# Patient Record
Sex: Male | Born: 1945
Health system: Southern US, Community
[De-identification: ages and names within clinical notes are randomized; demographics above are authoritative.]

## PROBLEM LIST (undated history)

## (undated) DIAGNOSIS — M1990 Unspecified osteoarthritis, unspecified site: Secondary | ICD-10-CM

## (undated) DIAGNOSIS — I10 Essential (primary) hypertension: Secondary | ICD-10-CM

## (undated) DIAGNOSIS — E785 Hyperlipidemia, unspecified: Secondary | ICD-10-CM

## (undated) DIAGNOSIS — Z974 Presence of external hearing-aid: Secondary | ICD-10-CM

## (undated) DIAGNOSIS — K219 Gastro-esophageal reflux disease without esophagitis: Secondary | ICD-10-CM

## (undated) DIAGNOSIS — G5603 Carpal tunnel syndrome, bilateral upper limbs: Secondary | ICD-10-CM

## (undated) HISTORY — DX: Essential (primary) hypertension: I10

## (undated) HISTORY — DX: Hyperlipidemia, unspecified: E78.5

## (undated) HISTORY — DX: Unspecified osteoarthritis, unspecified site: M19.90

## (undated) HISTORY — PX: HERNIA REPAIR: SHX51

## (undated) HISTORY — PX: KNEE SURGERY: SHX244

---

## 2003-05-09 HISTORY — PX: COLONOSCOPY: SHX174

## 2010-02-18 ENCOUNTER — Ambulatory Visit: Payer: Self-pay | Admitting: Family Medicine

## 2015-02-26 DIAGNOSIS — I10 Essential (primary) hypertension: Secondary | ICD-10-CM | POA: Diagnosis not present

## 2015-03-29 ENCOUNTER — Other Ambulatory Visit: Payer: Self-pay | Admitting: Family Medicine

## 2015-04-30 ENCOUNTER — Other Ambulatory Visit: Payer: Self-pay | Admitting: Family Medicine

## 2015-05-30 ENCOUNTER — Other Ambulatory Visit: Payer: Self-pay | Admitting: Family Medicine

## 2015-06-01 ENCOUNTER — Encounter: Payer: Self-pay | Admitting: Family Medicine

## 2015-06-01 ENCOUNTER — Ambulatory Visit (INDEPENDENT_AMBULATORY_CARE_PROVIDER_SITE_OTHER): Payer: Medicare HMO | Admitting: Family Medicine

## 2015-06-01 VITALS — BP 130/70 | HR 62 | Ht 72.0 in | Wt 207.0 lb

## 2015-06-01 DIAGNOSIS — I10 Essential (primary) hypertension: Secondary | ICD-10-CM

## 2015-06-01 DIAGNOSIS — M19042 Primary osteoarthritis, left hand: Secondary | ICD-10-CM

## 2015-06-01 DIAGNOSIS — Z23 Encounter for immunization: Secondary | ICD-10-CM | POA: Diagnosis not present

## 2015-06-01 DIAGNOSIS — Z1211 Encounter for screening for malignant neoplasm of colon: Secondary | ICD-10-CM | POA: Diagnosis not present

## 2015-06-01 DIAGNOSIS — E785 Hyperlipidemia, unspecified: Secondary | ICD-10-CM | POA: Diagnosis not present

## 2015-06-01 DIAGNOSIS — M1990 Unspecified osteoarthritis, unspecified site: Secondary | ICD-10-CM

## 2015-06-01 DIAGNOSIS — M19041 Primary osteoarthritis, right hand: Secondary | ICD-10-CM

## 2015-06-01 LAB — HEMOCCULT GUIAC POC 1CARD (OFFICE): Fecal Occult Blood, POC: NEGATIVE

## 2015-06-01 MED ORDER — OMEGA-3-ACID ETHYL ESTERS 1 G PO CAPS: 1.0000 g | ORAL_CAPSULE | Freq: Two times a day (BID) | ORAL | Status: DC

## 2015-06-01 MED ORDER — AMLODIPINE BESYLATE 10 MG PO TABS: 10.0000 mg | ORAL_TABLET | Freq: Every day | ORAL | Status: DC

## 2015-06-01 MED ORDER — ASPIRIN 81 MG PO TABS: 81.0000 mg | ORAL_TABLET | Freq: Every day | ORAL | Status: AC

## 2015-06-01 MED ORDER — IBUPROFEN 200 MG PO TABS: 200.0000 mg | ORAL_TABLET | Freq: Four times a day (QID) | ORAL | Status: DC | PRN

## 2015-06-01 NOTE — Progress Notes (Signed)
Name: Angel Cummings.   MRN: [id number]    DOB: 1945-05-30   Date:06/01/2015       Progress Note  Subjective  Chief Complaint  Chief Complaint  Patient presents with  . Hypertension    Hypertension This is a chronic problem. The current episode started more than 1 year ago. The problem has been gradually improving since onset. The problem is controlled. Pertinent negatives include no anxiety, blurred vision, chest pain, headaches, malaise/fatigue, neck pain, orthopnea, palpitations, peripheral edema, PND, shortness of breath or sweats. Agents associated with hypertension include NSAIDs. Risk factors for coronary artery disease include dyslipidemia and male gender. Past treatments include calcium channel blockers. The current treatment provides moderate improvement. There are no compliance problems.  There is no history of angina, kidney disease, CAD/MI, CVA, heart failure, left ventricular hypertrophy, PVD, renovascular disease or retinopathy. There is no history of chronic renal disease or a hypertension causing med.  Hyperlipidemia This is a recurrent problem. The current episode started more than 1 month ago. The problem is controlled. He has no history of chronic renal disease. Pertinent negatives include no chest pain, focal weakness, myalgias or shortness of breath. Treatments tried: omega 3. The current treatment provides mild improvement of lipids. There are no compliance problems.     No problem-specific assessment & plan notes found for this encounter.   Past Medical History  Diagnosis Date  . Arthritis   . Hyperlipidemia   . Hypertension     Past Surgical History  Procedure Laterality Date  . Hernia repair    . Knee surgery Left   . Colonoscopy  2005    Dr Vickki Muff in [facility]    History reviewed. No pertinent family history.  Social History   Social History  . Marital Status: Single    Spouse Name: N/A  . Number of Children: N/A  . Years of Education: N/A    Occupational History  . Not on file.   Social History Main Topics  . Smoking status: Never Smoker   . Smokeless tobacco: Not on file  . Alcohol Use: 0.0 oz/week    0 Standard drinks or equivalent per week  . Drug Use: No  . Sexual Activity: Not Currently   Other Topics Concern  . Not on file   Social History Narrative  . No narrative on file    Allergies  Allergen Reactions  . Etodolac      Review of Systems  Constitutional: Negative for fever, chills, weight loss and malaise/fatigue.  HENT: Negative for ear discharge, ear pain and sore throat.   Eyes: Negative for blurred vision.  Respiratory: Negative for cough, sputum production, shortness of breath and wheezing.   Cardiovascular: Negative for chest pain, palpitations, orthopnea, leg swelling and PND.  Gastrointestinal: Negative for heartburn, nausea, abdominal pain, diarrhea, constipation, blood in stool and melena.  Genitourinary: Negative for dysuria, urgency, frequency and hematuria.  Musculoskeletal: Positive for joint pain. Negative for myalgias, back pain and neck pain.  Skin: Negative for rash.  Neurological: Negative for dizziness, tingling, sensory change, focal weakness and headaches.  Endo/Heme/Allergies: Negative for environmental allergies and polydipsia. Does not bruise/bleed easily.  Psychiatric/Behavioral: Negative for depression and suicidal ideas. The patient is not nervous/anxious and does not have insomnia.      Objective  Filed Vitals:   06/01/15 0819  BP: 130/70  Pulse: 62  Height: 6' (1.829 m)  Weight: 207 lb (93.895 kg)    Physical Exam  Constitutional: He is oriented  to person, place, and time and well-developed, well-nourished, and in no distress.  HENT:  Head: Normocephalic.  Right Ear: External ear normal.  Left Ear: External ear normal.  Nose: Nose normal.  Mouth/Throat: Oropharynx is clear and moist.  Eyes: Conjunctivae and EOM are normal. Pupils are equal, round, and  reactive to light. Right eye exhibits no discharge. Left eye exhibits no discharge. No scleral icterus.  Neck: Normal range of motion. Neck supple. No JVD present. No tracheal deviation present. No thyromegaly present.  Cardiovascular: Normal rate, regular rhythm, normal heart sounds and intact distal pulses.  Exam reveals no gallop and no friction rub.   No murmur heard. Pulmonary/Chest: Breath sounds normal. No respiratory distress. He has no wheezes. He has no rales.  Abdominal: Soft. Bowel sounds are normal. He exhibits no mass. There is no hepatosplenomegaly. There is no tenderness. There is no rebound, no guarding and no CVA tenderness.  Genitourinary: Rectum normal.  Musculoskeletal: Normal range of motion. He exhibits no edema or tenderness.  Lymphadenopathy:    He has no cervical adenopathy.  Neurological: He is alert and oriented to person, place, and time. He has normal sensation, normal strength, normal reflexes and intact cranial nerves. No cranial nerve deficit.  Skin: Skin is warm. No rash noted.  Psychiatric: Mood and affect normal.  Nursing note and vitals reviewed.     Assessment & Plan  Problem List Items Addressed This Visit      Cardiovascular and Mediastinum   Essential hypertension - Primary   Relevant Medications   amLODipine (NORVASC) 10 MG tablet   aspirin 81 MG tablet   omega-3 acid ethyl esters (LOVAZA) 1 g capsule   Other Relevant Orders   Renal Function Panel     Other   Hyperlipidemia   Relevant Medications   amLODipine (NORVASC) 10 MG tablet   aspirin 81 MG tablet   omega-3 acid ethyl esters (LOVAZA) 1 g capsule   Other Relevant Orders   Lipid Profile    Other Visit Diagnoses    Arthritis of both hands        Relevant Medications    aspirin 81 MG tablet    ibuprofen (ADVIL,MOTRIN) 200 MG tablet    Colon cancer screening        Relevant Orders    Ambulatory referral to Gastroenterology    POCT Occult Blood Stool (Completed)    Need for  Tdap vaccination        Relevant Orders    Tdap vaccine greater than or equal to 7yo IM (Completed)    Need for pneumococcal vaccination        Relevant Orders    Pneumococcal conjugate vaccine 13-valent (Completed)         Dr. Khloie [facility]  06/01/2015

## 2015-06-02 LAB — RENAL FUNCTION PANEL
Albumin: 4.2 g/dL (ref 3.6–4.8)
BUN/Creatinine Ratio: 13 (ref 10–22)
BUN: 12 mg/dL (ref 8–27)
CO2: 23 mmol/L (ref 18–29)
CREATININE: 0.95 mg/dL (ref 0.76–1.27)
Calcium: 8.7 mg/dL (ref 8.6–10.2)
Chloride: 104 mmol/L (ref 96–106)
GFR, EST AFRICAN AMERICAN: 95 mL/min/{1.73_m2} (ref >59)
GFR, EST NON AFRICAN AMERICAN: 82 mL/min/{1.73_m2} (ref >59)
GLUCOSE: 99 mg/dL (ref 65–99)
POTASSIUM: 4.1 mmol/L (ref 3.5–5.2)
Phosphorus: 3.1 mg/dL (ref 2.5–4.5)
SODIUM: 143 mmol/L (ref 134–144)

## 2015-06-02 LAB — LIPID PANEL
CHOLESTEROL TOTAL: 223 mg/dL — AB (ref 100–199)
Chol/HDL Ratio: 4.7 ratio units (ref 0.0–5.0)
HDL: 47 mg/dL (ref >39)
LDL Calculated: 150 mg/dL — ABNORMAL HIGH (ref 0–99)
TRIGLYCERIDES: 128 mg/dL (ref 0–149)
VLDL Cholesterol Cal: 26 mg/dL (ref 5–40)

## 2015-06-04 ENCOUNTER — Other Ambulatory Visit: Payer: Self-pay

## 2015-06-04 DIAGNOSIS — M1990 Unspecified osteoarthritis, unspecified site: Secondary | ICD-10-CM

## 2015-06-04 MED ORDER — DICLOFENAC SODIUM 1 % TD GEL: 2.0000 g | Freq: Two times a day (BID) | TRANSDERMAL | Status: DC

## 2015-06-21 ENCOUNTER — Encounter: Payer: Self-pay | Admitting: Family Medicine

## 2015-06-21 ENCOUNTER — Ambulatory Visit (INDEPENDENT_AMBULATORY_CARE_PROVIDER_SITE_OTHER): Payer: Medicare HMO | Admitting: Family Medicine

## 2015-06-21 VITALS — BP 138/72 | HR 80 | Temp 99.2°F | Ht 72.0 in | Wt 202.0 lb

## 2015-06-21 DIAGNOSIS — J40 Bronchitis, not specified as acute or chronic: Secondary | ICD-10-CM

## 2015-06-21 DIAGNOSIS — J0100 Acute maxillary sinusitis, unspecified: Secondary | ICD-10-CM

## 2015-06-21 MED ORDER — GUAIFENESIN-CODEINE 100-10 MG/5ML PO SYRP: 5.0000 mL | ORAL_SOLUTION | Freq: Three times a day (TID) | ORAL | Status: DC | PRN

## 2015-06-21 MED ORDER — AMOXICILLIN 500 MG PO CAPS: 500.0000 mg | ORAL_CAPSULE | Freq: Three times a day (TID) | ORAL | Status: DC

## 2015-06-21 NOTE — Progress Notes (Signed)
Name: Angel Cummings.   MRN: [id number]    DOB: July 29, 1945   Date:06/21/2015       Progress Note  Subjective  Chief Complaint  Chief Complaint  Patient presents with  . Sinusitis    cough and cong- tried mucinex D- hasn't helped. coughing up thick and colored production    Sinusitis This is a new problem. The current episode started in the past 7 days. Associated symptoms include chills, congestion, coughing, diaphoresis, headaches, a hoarse voice, sinus pressure, sneezing, a sore throat and swollen glands. Pertinent negatives include no ear pain, neck pain or shortness of breath. (Bloody sinus drainage) Past treatments include acetaminophen and oral decongestants. The treatment provided mild relief.  Cough This is a new problem. The current episode started in the past 7 days. The problem has been gradually worsening. The cough is productive of purulent sputum. Associated symptoms include chills, headaches and a sore throat. Pertinent negatives include no chest pain, ear pain, fever, heartburn, myalgias, rash, shortness of breath, weight loss or wheezing. The treatment provided no relief. There is no history of environmental allergies.    No problem-specific assessment & plan notes found for this encounter.   Past Medical History  Diagnosis Date  . Arthritis   . Hyperlipidemia   . Hypertension     Past Surgical History  Procedure Laterality Date  . Hernia repair    . Knee surgery Left   . Colonoscopy  2005    Dr Vickki Muff in [facility]    History reviewed. No pertinent family history.  Social History   Social History  . Marital Status: Single    Spouse Name: N/A  . Number of Children: N/A  . Years of Education: N/A   Occupational History  . Not on file.   Social History Main Topics  . Smoking status: Never Smoker   . Smokeless tobacco: Not on file  . Alcohol Use: 0.0 oz/week    0 Standard drinks or equivalent per week  . Drug Use: No  . Sexual Activity: Not Currently    Other Topics Concern  . Not on file   Social History Narrative    Allergies  Allergen Reactions  . Etodolac      Review of Systems  Constitutional: Positive for chills and diaphoresis. Negative for fever, weight loss and malaise/fatigue.  HENT: Positive for congestion, hoarse voice, sinus pressure, sneezing and sore throat. Negative for ear discharge and ear pain.   Eyes: Negative for blurred vision.  Respiratory: Positive for cough. Negative for sputum production, shortness of breath and wheezing.   Cardiovascular: Negative for chest pain, palpitations and leg swelling.  Gastrointestinal: Negative for heartburn, nausea, abdominal pain, diarrhea, constipation, blood in stool and melena.  Genitourinary: Negative for dysuria, urgency, frequency and hematuria.  Musculoskeletal: Negative for myalgias, back pain, joint pain and neck pain.  Skin: Negative for rash.  Neurological: Positive for headaches. Negative for dizziness, tingling, sensory change and focal weakness.  Endo/Heme/Allergies: Negative for environmental allergies and polydipsia. Does not bruise/bleed easily.  Psychiatric/Behavioral: Negative for depression and suicidal ideas. The patient is not nervous/anxious and does not have insomnia.      Objective  Filed Vitals:   06/21/15 0806  BP: 138/72  Pulse: 80  Temp: 99.2 F (37.3 C)  TempSrc: Oral  Height: 6' (1.829 m)  Weight: 202 lb (91.627 kg)    Physical Exam  Constitutional: He is oriented to person, place, and time and well-developed, well-nourished, and in no distress.  HENT:  Head: Normocephalic.  Right Ear: Tympanic membrane, external ear and ear canal normal. Decreased hearing is noted.  Left Ear: Tympanic membrane, external ear and ear canal normal. Decreased hearing is noted.  Nose: Nose normal.  Mouth/Throat: Oropharynx is clear and moist.  Eyes: Conjunctivae and EOM are normal. Pupils are equal, round, and reactive to light. Right eye exhibits  no discharge. Left eye exhibits no discharge. No scleral icterus.  Neck: Normal range of motion. Neck supple. No JVD present. No tracheal deviation present. No thyromegaly present.  Cardiovascular: Normal rate, regular rhythm, normal heart sounds and intact distal pulses.  Exam reveals no gallop and no friction rub.   No murmur heard. Pulmonary/Chest: Breath sounds normal. No respiratory distress. He has no wheezes. He has no rales.  Abdominal: Soft. Bowel sounds are normal. He exhibits no mass. There is no hepatosplenomegaly. There is no tenderness. There is no rebound, no guarding and no CVA tenderness.  Musculoskeletal: Normal range of motion. He exhibits no edema or tenderness.  Lymphadenopathy:    He has no cervical adenopathy.  Neurological: He is alert and oriented to person, place, and time. He has normal sensation, normal strength and intact cranial nerves. No cranial nerve deficit.  Skin: Skin is warm. No rash noted.  Psychiatric: Mood and affect normal.      Assessment & Plan  Problem List Items Addressed This Visit    None    Visit Diagnoses    Acute maxillary sinusitis, recurrence not specified    -  Primary    Relevant Medications    amoxicillin (AMOXIL) 500 MG capsule    guaiFENesin-codeine (ROBITUSSIN AC) 100-10 MG/5ML syrup    Bronchitis        Relevant Medications    amoxicillin (AMOXIL) 500 MG capsule    guaiFENesin-codeine (ROBITUSSIN AC) 100-10 MG/5ML syrup         Dr. Deanna Jones [facility]  06/21/2015

## 2015-12-03 DIAGNOSIS — Z0000 Encounter for general adult medical examination without abnormal findings: Secondary | ICD-10-CM | POA: Diagnosis not present

## 2015-12-03 DIAGNOSIS — I10 Essential (primary) hypertension: Secondary | ICD-10-CM | POA: Diagnosis not present

## 2015-12-09 ENCOUNTER — Other Ambulatory Visit: Payer: Self-pay

## 2015-12-29 ENCOUNTER — Other Ambulatory Visit: Payer: Self-pay | Admitting: Family Medicine

## 2015-12-29 DIAGNOSIS — I10 Essential (primary) hypertension: Secondary | ICD-10-CM

## 2016-01-30 ENCOUNTER — Other Ambulatory Visit: Payer: Self-pay | Admitting: Family Medicine

## 2016-01-30 DIAGNOSIS — I10 Essential (primary) hypertension: Secondary | ICD-10-CM

## 2016-02-29 ENCOUNTER — Other Ambulatory Visit: Payer: Self-pay | Admitting: Family Medicine

## 2016-02-29 DIAGNOSIS — I10 Essential (primary) hypertension: Secondary | ICD-10-CM

## 2016-04-07 ENCOUNTER — Other Ambulatory Visit: Payer: Self-pay | Admitting: Family Medicine

## 2016-04-07 ENCOUNTER — Other Ambulatory Visit: Payer: Self-pay

## 2016-04-07 DIAGNOSIS — I10 Essential (primary) hypertension: Secondary | ICD-10-CM

## 2016-04-07 MED ORDER — AMLODIPINE BESYLATE 10 MG PO TABS: 10.0000 mg | ORAL_TABLET | Freq: Every day | ORAL | 0 refills | Status: DC

## 2016-04-17 ENCOUNTER — Other Ambulatory Visit: Payer: Self-pay | Admitting: Family Medicine

## 2016-04-17 ENCOUNTER — Ambulatory Visit
Admission: RE | Admit: 2016-04-17 | Discharge: 2016-04-17 | Disposition: A | Discharge status: Home / Self Care | Payer: Medicare HMO | Source: Ambulatory Visit | Attending: Family Medicine | Admitting: Family Medicine

## 2016-04-17 ENCOUNTER — Ambulatory Visit (INDEPENDENT_AMBULATORY_CARE_PROVIDER_SITE_OTHER): Payer: Medicare HMO | Admitting: Family Medicine

## 2016-04-17 ENCOUNTER — Encounter: Payer: Self-pay | Admitting: Family Medicine

## 2016-04-17 VITALS — BP 160/80 | HR 64 | Ht 71.0 in | Wt 206.0 lb

## 2016-04-17 DIAGNOSIS — M19041 Primary osteoarthritis, right hand: Secondary | ICD-10-CM | POA: Insufficient documentation

## 2016-04-17 DIAGNOSIS — E782 Mixed hyperlipidemia: Secondary | ICD-10-CM | POA: Diagnosis not present

## 2016-04-17 DIAGNOSIS — M19042 Primary osteoarthritis, left hand: Secondary | ICD-10-CM | POA: Diagnosis not present

## 2016-04-17 DIAGNOSIS — M19049 Primary osteoarthritis, unspecified hand: Secondary | ICD-10-CM | POA: Diagnosis not present

## 2016-04-17 DIAGNOSIS — I10 Essential (primary) hypertension: Secondary | ICD-10-CM

## 2016-04-17 MED ORDER — AMLODIPINE BESYLATE 10 MG PO TABS: 10.0000 mg | ORAL_TABLET | Freq: Every day | ORAL | 6 refills | Status: DC

## 2016-04-17 MED ORDER — OMEGA-3-ACID ETHYL ESTERS 1 G PO CAPS: 1.0000 g | ORAL_CAPSULE | Freq: Two times a day (BID) | ORAL | 6 refills | Status: DC

## 2016-04-17 NOTE — Progress Notes (Signed)
Name: Angel Cummings.   MRN: [id number]    DOB: June 22, 1945   Date:04/17/2016       Progress Note  Subjective  Chief Complaint  Chief Complaint  Patient presents with  . Hypertension    Hypertension  This is a chronic problem. The current episode started more than 1 year ago. The problem has been gradually improving since onset. The problem is controlled (did not take medication this am). Pertinent negatives include no anxiety, blurred vision, chest pain, headaches, malaise/fatigue, neck pain, orthopnea, palpitations, peripheral edema, PND, shortness of breath or sweats. There are no associated agents to hypertension. There are no known risk factors for coronary artery disease. Past treatments include ACE inhibitors and diuretics. The current treatment provides mild improvement. There are no compliance problems.  There is no history of angina, kidney disease, CAD/MI, CVA, heart failure, left ventricular hypertrophy, PVD, renovascular disease or retinopathy. There is no history of chronic renal disease or a hypertension causing med.  Hand Pain   The incident occurred more than 1 week ago. There was no injury mechanism. The pain is present in the right fingers and left fingers. The quality of the pain is described as cramping (gradually flexion due to tendon fibrosis). The pain does not radiate. The pain is at a severity of 2/10. The pain is moderate. The pain has been worsening since the incident. Pertinent negatives include no chest pain or tingling. The symptoms are aggravated by movement. He has tried NSAIDs for the symptoms. The treatment provided moderate relief.    No problem-specific Assessment & Plan notes found for this encounter.   Past Medical History:  Diagnosis Date  . Arthritis   . Hyperlipidemia   . Hypertension     Past Surgical History:  Procedure Laterality Date  . COLONOSCOPY  2005   Dr Vickki Muff in [place] Left     History reviewed.  No pertinent family history.  Social History   Social History  . Marital status: Single    Spouse name: N/A  . Number of children: N/A  . Years of education: N/A   Occupational History  . Not on file.   Social History Main Topics  . Smoking status: Never Smoker  . Smokeless tobacco: Not on file  . Alcohol use 0.0 oz/week  . Drug use: No  . Sexual activity: Not Currently   Other Topics Concern  . Not on file   Social History Narrative  . No narrative on file    Allergies  Allergen Reactions  . Etodolac      Review of Systems  Constitutional: Negative for chills, fever, malaise/fatigue and weight loss.  HENT: Negative for ear discharge, ear pain and sore throat.   Eyes: Negative for blurred vision.  Respiratory: Negative for cough, sputum production, shortness of breath and wheezing.   Cardiovascular: Negative for chest pain, palpitations, orthopnea, leg swelling and PND.  Gastrointestinal: Negative for abdominal pain, blood in stool, constipation, diarrhea, heartburn, melena and nausea.  Genitourinary: Negative for dysuria, frequency, hematuria and urgency.  Musculoskeletal: Negative for back pain, joint pain, myalgias and neck pain.  Skin: Negative for rash.  Neurological: Negative for dizziness, tingling, sensory change, focal weakness and headaches.  Endo/Heme/Allergies: Negative for environmental allergies and polydipsia. Does not bruise/bleed easily.  Psychiatric/Behavioral: Negative for depression and suicidal ideas. The patient is not nervous/anxious and does not have insomnia.      Objective  Vitals:  04/17/16 0909  BP: (!) 160/80  Pulse: 64  Weight: 206 lb (93.4 kg)  Height: 5\' 11"  (1.803 m)    Physical Exam  Constitutional: He is oriented to person, place, and time and well-developed, well-nourished, and in no distress.  HENT:  Head: Normocephalic.  Right Ear: External ear normal.  Left Ear: External ear normal.  Nose: Nose normal.   Mouth/Throat: Oropharynx is clear and moist.  Eyes: Conjunctivae and EOM are normal. Pupils are equal, round, and reactive to light. Right eye exhibits no discharge. Left eye exhibits no discharge. No scleral icterus.  Neck: Normal range of motion. Neck supple. No JVD present. No tracheal deviation present. No thyromegaly present.  Cardiovascular: Normal rate, regular rhythm, normal heart sounds and intact distal pulses.  Exam reveals no gallop and no friction rub.   No murmur heard. Pulmonary/Chest: Breath sounds normal. No respiratory distress. He has no wheezes. He has no rales.  Abdominal: Soft. Bowel sounds are normal. He exhibits no mass. There is no hepatosplenomegaly. There is no tenderness. There is no rebound, no guarding and no CVA tenderness.  Musculoskeletal: He exhibits no edema or tenderness.       Right hand: He exhibits decreased range of motion and deformity.       Left hand: He exhibits decreased range of motion and deformity.  Lymphadenopathy:    He has no cervical adenopathy.  Neurological: He is alert and oriented to person, place, and time. He has normal sensation, normal strength, normal reflexes and intact cranial nerves. No cranial nerve deficit.  Skin: Skin is warm. No rash noted.  Psychiatric: Mood and affect normal.  Nursing note and vitals reviewed.     Assessment & Plan  Problem List Items Addressed This Visit      Cardiovascular and Mediastinum   Essential hypertension - Primary   Relevant Medications   amLODipine (NORVASC) 10 MG tablet   omega-3 acid ethyl esters (LOVAZA) 1 g capsule   Other Relevant Orders   Renal Function Panel     Musculoskeletal and Integument   Arthritis of both hands   Relevant Orders   DG Hand Complete Left   DG Hand Complete Right   Arthritis of hand     Other   Hyperlipidemia   Relevant Medications   amLODipine (NORVASC) 10 MG tablet   omega-3 acid ethyl esters (LOVAZA) 1 g capsule   Other Relevant Orders    Lipid Profile        Dr. Otilio [facility]u [facility]  04/17/16

## 2016-04-18 LAB — RENAL FUNCTION PANEL
Albumin: 4.5 g/dL (ref 3.6–4.8)
BUN / CREAT RATIO: 18 (ref 10–24)
BUN: 16 mg/dL (ref 8–27)
CHLORIDE: 105 mmol/L (ref 96–106)
CO2: 23 mmol/L (ref 18–29)
Calcium: 9 mg/dL (ref 8.6–10.2)
Creatinine, Ser: 0.88 mg/dL (ref 0.76–1.27)
GFR calc non Af Amer: 88 mL/min/{1.73_m2} (ref >59)
GFR, EST AFRICAN AMERICAN: 102 mL/min/{1.73_m2} (ref >59)
GLUCOSE: 91 mg/dL (ref 65–99)
POTASSIUM: 4.5 mmol/L (ref 3.5–5.2)
Phosphorus: 3.2 mg/dL (ref 2.5–4.5)
SODIUM: 146 mmol/L — AB (ref 134–144)

## 2016-04-18 LAB — LIPID PANEL
Chol/HDL Ratio: 4.3 ratio units (ref 0.0–5.0)
Cholesterol, Total: 230 mg/dL — ABNORMAL HIGH (ref 100–199)
HDL: 53 mg/dL (ref >39)
LDL Calculated: 152 mg/dL — ABNORMAL HIGH (ref 0–99)
Triglycerides: 127 mg/dL (ref 0–149)
VLDL CHOLESTEROL CAL: 25 mg/dL (ref 5–40)

## 2016-04-21 ENCOUNTER — Other Ambulatory Visit: Payer: Self-pay

## 2016-04-21 DIAGNOSIS — M79641 Pain in right hand: Secondary | ICD-10-CM

## 2016-04-21 DIAGNOSIS — M79642 Pain in left hand: Principal | ICD-10-CM

## 2016-05-09 ENCOUNTER — Other Ambulatory Visit: Payer: Self-pay

## 2016-05-09 DIAGNOSIS — E782 Mixed hyperlipidemia: Secondary | ICD-10-CM

## 2016-05-09 MED ORDER — OMEGA-3-ACID ETHYL ESTERS 1 G PO CAPS: 1.0000 g | ORAL_CAPSULE | Freq: Two times a day (BID) | ORAL | 6 refills | Status: DC

## 2016-05-15 DIAGNOSIS — D485 Neoplasm of uncertain behavior of skin: Secondary | ICD-10-CM | POA: Diagnosis not present

## 2016-05-15 DIAGNOSIS — C44612 Basal cell carcinoma of skin of right upper limb, including shoulder: Secondary | ICD-10-CM | POA: Diagnosis not present

## 2016-05-15 DIAGNOSIS — C44519 Basal cell carcinoma of skin of other part of trunk: Secondary | ICD-10-CM | POA: Diagnosis not present

## 2016-05-15 DIAGNOSIS — Z85828 Personal history of other malignant neoplasm of skin: Secondary | ICD-10-CM | POA: Diagnosis not present

## 2016-05-15 DIAGNOSIS — C4441 Basal cell carcinoma of skin of scalp and neck: Secondary | ICD-10-CM | POA: Diagnosis not present

## 2016-06-01 DIAGNOSIS — M19042 Primary osteoarthritis, left hand: Secondary | ICD-10-CM | POA: Diagnosis not present

## 2016-06-23 ENCOUNTER — Other Ambulatory Visit: Payer: Self-pay

## 2016-08-28 ENCOUNTER — Ambulatory Visit (INDEPENDENT_AMBULATORY_CARE_PROVIDER_SITE_OTHER): Payer: Medicare HMO | Admitting: Family Medicine

## 2016-08-28 ENCOUNTER — Other Ambulatory Visit: Payer: Self-pay

## 2016-08-28 VITALS — BP 138/80 | HR 64 | Ht 71.0 in | Wt 211.0 lb

## 2016-08-28 DIAGNOSIS — G8929 Other chronic pain: Secondary | ICD-10-CM | POA: Diagnosis not present

## 2016-08-28 DIAGNOSIS — R69 Illness, unspecified: Secondary | ICD-10-CM

## 2016-08-28 DIAGNOSIS — M79672 Pain in left foot: Secondary | ICD-10-CM

## 2016-08-28 NOTE — Progress Notes (Signed)
Name: Angel Cummings.   MRN: [id number]    DOB: Oct 19, 1945   Date:08/28/2016       Progress Note  Subjective  Chief Complaint  Chief Complaint  Patient presents with  . Foot Pain    L) foot- thinks he has plantar fasciaitis- has been taking Meloxicam with no relief- needs appt to podiatry. Mainly hurting in the heel that gets worse as the day goes on    Foot Pain  This is a new problem. The current episode started more than 1 month ago. The problem occurs constantly. The problem has been waxing and waning. Pertinent negatives include no abdominal pain, anorexia, arthralgias, change in bowel habit, chest pain, chills, congestion, coughing, diaphoresis, fatigue, fever, headaches, joint swelling, myalgias, nausea, neck pain, numbness, rash, sore throat, swollen glands, urinary symptoms, vertigo, visual change, vomiting or weakness. The symptoms are aggravated by standing and walking. He has tried NSAIDs and acetaminophen for the symptoms. The treatment provided mild relief.    No problem-specific Assessment & Plan notes found for this encounter.   Past Medical History:  Diagnosis Date  . Arthritis   . Hyperlipidemia   . Hypertension     Past Surgical History:  Procedure Laterality Date  . COLONOSCOPY  2005   Dr Vickki Muff in [place] Left     No family history on file.  Social History   Social History  . Marital status: Single    Spouse name: N/A  . Number of children: N/A  . Years of education: N/A   Occupational History  . Not on file.   Social History Main Topics  . Smoking status: Never Smoker  . Smokeless tobacco: Not on file  . Alcohol use 0.0 oz/week  . Drug use: No  . Sexual activity: Not Currently   Other Topics Concern  . Not on file   Social History Narrative  . No narrative on file    Allergies  Allergen Reactions  . Etodolac     Outpatient Medications Prior to Visit  Medication Sig Dispense Refill  . amLODipine  (NORVASC) 10 MG tablet Take 1 tablet (10 mg total) by mouth daily. 30 tablet 6  . aspirin 81 MG tablet Take 1 tablet (81 mg total) by mouth daily. 60 tablet 6  . ibuprofen (ADVIL,MOTRIN) 200 MG tablet Take 1 tablet (200 mg total) by mouth every 6 (six) hours as needed. 30 tablet 11  . omega-3 acid ethyl esters (LOVAZA) 1 g capsule Take 1 capsule (1 g total) by mouth 2 (two) times daily. 60 capsule 6   No facility-administered medications prior to visit.     Review of Systems  Constitutional: Negative for chills, diaphoresis, fatigue, fever, malaise/fatigue and weight loss.  HENT: Negative for congestion, ear discharge, ear pain and sore throat.   Eyes: Negative for blurred vision.  Respiratory: Negative for cough, sputum production, shortness of breath and wheezing.   Cardiovascular: Negative for chest pain, palpitations and leg swelling.  Gastrointestinal: Negative for abdominal pain, anorexia, blood in stool, change in bowel habit, constipation, diarrhea, heartburn, melena, nausea and vomiting.  Genitourinary: Negative for dysuria, frequency, hematuria and urgency.  Musculoskeletal: Negative for arthralgias, back pain, joint pain, joint swelling, myalgias and neck pain.  Skin: Negative for rash.  Neurological: Negative for dizziness, vertigo, tingling, sensory change, focal weakness, weakness, numbness and headaches.  Endo/Heme/Allergies: Negative for environmental allergies and polydipsia. Does not bruise/bleed easily.  Psychiatric/Behavioral: Negative for  depression and suicidal ideas. The patient is not nervous/anxious and does not have insomnia.      Objective  Vitals:   08/28/16 1337  BP: 138/80  Pulse: 64  Weight: 211 lb (95.7 kg)  Height: 5\' 11"  (1.803 m)    Physical Exam  Constitutional: He is oriented to person, place, and time and well-developed, well-nourished, and in no distress.  HENT:  Head: Normocephalic.  Right Ear: External ear normal.  Left Ear: External ear  normal.  Nose: Nose normal.  Mouth/Throat: Oropharynx is clear and moist.  Eyes: Conjunctivae and EOM are normal. Pupils are equal, round, and reactive to light. Right eye exhibits no discharge. Left eye exhibits no discharge. No scleral icterus.  Neck: Normal range of motion. Neck supple. No JVD present. No tracheal deviation present. No thyromegaly present.  Cardiovascular: Normal rate, regular rhythm, normal heart sounds and intact distal pulses.  Exam reveals no gallop and no friction rub.   No murmur heard. Pulmonary/Chest: Breath sounds normal. No respiratory distress. He has no wheezes. He has no rales.  Abdominal: Soft. Bowel sounds are normal. He exhibits no mass. There is no hepatosplenomegaly. There is no tenderness. There is no rebound, no guarding and no CVA tenderness.  Musculoskeletal: Normal range of motion. He exhibits no edema.       Left foot: There is tenderness.  Tenderness heelpad  Lymphadenopathy:    He has no cervical adenopathy.  Neurological: He is alert and oriented to person, place, and time. He has normal sensation, normal strength and intact cranial nerves. No cranial nerve deficit.  Skin: Skin is warm. No rash noted.  Psychiatric: Mood and affect normal.  Nursing note and vitals reviewed.     Assessment & Plan  Problem List Items Addressed This Visit    None    Visit Diagnoses    Heel pain, chronic, left    -  Primary   heel pad atrophy   Relevant Medications   meloxicam (MOBIC) 15 MG tablet   Other Relevant Orders   Ambulatory referral to Podiatry   Taking medication for chronic disease       Relevant Orders   Renal Function Panel      No orders of the defined types were placed in this encounter.     Dr. [facility]  08/28/16

## 2016-08-28 NOTE — Patient Instructions (Signed)
Heel Pad Atrophy Heel pad atrophy is a cause of heel pain. Your heels take much of the impact when you stand, walk, or run. A fat pad under your heel bone protects it and absorbs shock. The fat pad is made of fat cells that are separated by bands of connecting tissue. Over time, the fat pad can break down and lose elasticity and size (atrophy). This causes heel pain due to decreased cushioning in your heel. In many cases, the pain occurs in both heels. You may feel an aching or burning pain that gets worse while you are walking or standing. This pain may be worse for athletes who play sports on hard surfaces with high impact on the heel. What are the causes? This condition happens when the fat pad under your heel bone breaks down over time. What increases the risk? This condition is more likely to develop in people who:  Are age 69 or older.  Are athletes who play a sport with jumping and landing on hard surfaces, such as basketball.  Are runners, especially if they land on their heels first when they run.  Are overweight.  Have diabetes, rheumatoid arthritis, or blood vessel disease.  Have received steroid injections in the heel area.  Have had trauma to the heel area, such as falling from a height. What are the signs or symptoms? The most common symptom of this condition is burning or aching pain in one heel or both heels. The pain may be worse when:  Walking or standing, especially on hard surfaces or for long amounts of time.  Walking barefoot.  Wearing hard-soled shoes.  Resting at night.  Pressing on the center of the heel (tenderness). How is this diagnosed? This condition is diagnosed based on your symptoms, medical history, and a physical exam. Your health care provider will check your heel pad and see if you have tenderness in the center of your heel. You may also have an ultrasound to confirm the diagnosis and measure the thickness of your fat pad. How is this  treated? Treatment for this condition includes:  Reducing your time spent walking, standing, or running.  Avoiding activities that cause pain.  Taking an over-the-counter pain reliever or anti-inflammatory medicine.  Wearing shoes with thick heel cushioning or using a soft shoe insert (orthotic).  Wearing shoes at all times, even indoors.  Taping your heels to increase support.  Losing weight if you are overweight. Follow these instructions at home: Managing pain, stiffness, and swelling   If directed, apply ice to the injured area.  Put ice in a plastic bag.  Place a towel between your heel and the bag.  Leave the ice on for 20 minutes, 2-3 times a day.  Raise (elevate) the foot above the level of your heart while you are sitting or lying down. Activity   Return to your normal activities as told by your health care provider. Ask your health care provider what activities are safe for you.  Do exercises as told by your health care provider. General instructions   Do not use any tobacco products, such as cigarettes, chewing tobacco, and e-cigarettes. If you need help quitting, ask your health care provider.  Take over-the-counter and prescription medicines only as told by your health care provider.  Keep all follow-up visits as told by your health care provider. This is important. How is this prevented?  Give your body time to rest between periods of activity.  Wear comfortable and supportive shoes during  athletic activity. Contact a health care provider if:  Your symptoms do not improve or they get worse. This information is not intended to replace advice given to you by your health care provider. Make sure you discuss any questions you have with your health care provider. Document Released: 04/24/2005 Document Revised: 12/30/2015 Document Reviewed: 04/06/2015 Elsevier Interactive Patient Education  2017 [facility] Ask your health care  provider which exercises are safe for you. Do exercises exactly as told by your health care provider and adjust them as directed. It is normal to feel mild stretching, pulling, tightness, or discomfort as you do these exercises, but you should stop right away if you feel sudden pain or your pain gets worse. Do not begin these exercises until told by your health care provider. Stretching exercise This exercise improves the movement and flexibility of your calf muscles. This exercise may also help to relieve pain and stiffness. Exercise A: Gastroc and soleus, standing  1. Stand with the ball of your left / rightfoot on a step. The ball of your foot is on the walking surface, right under your toes. 2. Keep your other foot firmly on the same step. 3. Hold onto the wall, a railing, or a chair for balance. 4. Slowly lift your other foot, allowing your body weight to press your left / rightheel down over the edge of the step. You should feel a stretch in your left / rightcalf. 5. Hold this position for __________ seconds. 6. Return both feet to the step. 7. Repeat this exercise with a slight bend in your left / rightknee. Repeat __________ times with your left / right knee straight and __________ times with your left / right knee bent. Complete these stretches __________ times a day. Strengthening exercise This exercise builds strength and endurance in your foot and may help take pressure off of your heel. Endurance is the ability to use your muscles for a long time, even after they get tired. Exercise B: Arch lifts (  foot intrinsics) 1. Sit in a chair with your feet flat on the floor. 2. Keeping your big toe and your heel on the floor, lift only your arch, which is on the inner edge of your left / rightfoot. Do not move your knee or scrunch your toes. This is a small movement. 3. Hold this position for __________ seconds. 4. Return to the starting position. Repeat __________ times. Complete this  exercise __________ times a day. This information is not intended to replace advice given to you by your health care provider. Make sure you discuss any questions you have with your health care provider. Document Released: 04/24/2005 Document Revised: 12/28/2015 Document Reviewed: 04/06/2015 Elsevier Interactive Patient Education  2017 [employer].

## 2016-08-29 LAB — RENAL FUNCTION PANEL
ALBUMIN: 4.4 g/dL (ref 3.6–4.8)
BUN/Creatinine Ratio: 19 (ref 10–24)
BUN: 18 mg/dL (ref 8–27)
CO2: 24 mmol/L (ref 18–29)
Calcium: 9.8 mg/dL (ref 8.6–10.2)
Chloride: 101 mmol/L (ref 96–106)
Creatinine, Ser: 0.95 mg/dL (ref 0.76–1.27)
GFR calc Af Amer: 95 mL/min/{1.73_m2} (ref >59)
GFR, EST NON AFRICAN AMERICAN: 82 mL/min/{1.73_m2} (ref >59)
GLUCOSE: 103 mg/dL — AB (ref 65–99)
POTASSIUM: 3.8 mmol/L (ref 3.5–5.2)
Phosphorus: 3.2 mg/dL (ref 2.5–4.5)
Sodium: 142 mmol/L (ref 134–144)

## 2016-09-04 DIAGNOSIS — M79672 Pain in left foot: Secondary | ICD-10-CM | POA: Diagnosis not present

## 2016-09-04 DIAGNOSIS — M722 Plantar fascial fibromatosis: Secondary | ICD-10-CM | POA: Diagnosis not present

## 2016-09-25 DIAGNOSIS — M722 Plantar fascial fibromatosis: Secondary | ICD-10-CM | POA: Diagnosis not present

## 2016-09-25 DIAGNOSIS — M79672 Pain in left foot: Secondary | ICD-10-CM | POA: Diagnosis not present

## 2016-10-16 ENCOUNTER — Ambulatory Visit (INDEPENDENT_AMBULATORY_CARE_PROVIDER_SITE_OTHER): Payer: Medicare HMO | Admitting: Family Medicine

## 2016-10-16 ENCOUNTER — Encounter: Payer: Self-pay | Admitting: Family Medicine

## 2016-10-16 VITALS — BP 128/72 | HR 64 | Ht 71.0 in | Wt 204.0 lb

## 2016-10-16 DIAGNOSIS — M19041 Primary osteoarthritis, right hand: Secondary | ICD-10-CM | POA: Diagnosis not present

## 2016-10-16 DIAGNOSIS — E782 Mixed hyperlipidemia: Secondary | ICD-10-CM | POA: Diagnosis not present

## 2016-10-16 DIAGNOSIS — I10 Essential (primary) hypertension: Secondary | ICD-10-CM | POA: Diagnosis not present

## 2016-10-16 DIAGNOSIS — E663 Overweight: Secondary | ICD-10-CM

## 2016-10-16 DIAGNOSIS — M19042 Primary osteoarthritis, left hand: Secondary | ICD-10-CM | POA: Diagnosis not present

## 2016-10-16 MED ORDER — OMEGA-3-ACID ETHYL ESTERS 1 G PO CAPS: 1.0000 g | ORAL_CAPSULE | Freq: Two times a day (BID) | ORAL | 6 refills | Status: DC

## 2016-10-16 MED ORDER — IBUPROFEN 200 MG PO TABS: 200.0000 mg | ORAL_TABLET | Freq: Four times a day (QID) | ORAL | 11 refills | Status: DC | PRN

## 2016-10-16 MED ORDER — AMLODIPINE BESYLATE 10 MG PO TABS: 10.0000 mg | ORAL_TABLET | Freq: Every day | ORAL | 6 refills | Status: DC

## 2016-10-16 NOTE — Progress Notes (Signed)
Name: Angel Cummings.   MRN: [id number]    DOB: 04-06-1946   Date:10/16/2016       Progress Note  Subjective  Chief Complaint  Chief Complaint  Patient presents with  . Hypertension    refill B/P med    Hypertension  This is a chronic problem. The current episode started more than 1 year ago. The problem is unchanged. The problem is controlled. Pertinent negatives include no anxiety, blurred vision, chest pain, headaches, malaise/fatigue, neck pain, orthopnea, palpitations, peripheral edema, PND, shortness of breath or sweats. There are no associated agents to hypertension. There are no known risk factors for coronary artery disease. Past treatments include calcium channel blockers. The current treatment provides moderate improvement. There are no compliance problems.  There is no history of angina, kidney disease, CAD/MI, CVA, heart failure, left ventricular hypertrophy, PVD or retinopathy. There is no history of chronic renal disease, a hypertension causing med or renovascular disease.  Hyperlipidemia  This is a chronic problem. The problem is controlled. Recent lipid tests were reviewed and are normal. Exacerbating diseases include obesity. He has no history of chronic renal disease, diabetes, hypothyroidism, liver disease or nephrotic syndrome. Factors aggravating his hyperlipidemia include fatty foods. Pertinent negatives include no chest pain, focal weakness, myalgias or shortness of breath. Treatments tried: omega 3. The current treatment provides mild improvement of lipids. There are no compliance problems.  Risk factors for coronary artery disease include dyslipidemia, hypertension, male sex and obesity.    No problem-specific Assessment & Plan notes found for this encounter.   Past Medical History:  Diagnosis Date  . Arthritis   . Hyperlipidemia   . Hypertension     Past Surgical History:  Procedure Laterality Date  . COLONOSCOPY  2005   Dr Vickki Muff in [place] Left     No family history on file.  Social History   Social History  . Marital status: Single    Spouse name: N/A  . Number of children: N/A  . Years of education: N/A   Occupational History  . Not on file.   Social History Main Topics  . Smoking status: Never Smoker  . Smokeless tobacco: Never Used  . Alcohol use 0.0 oz/week  . Drug use: No  . Sexual activity: Not Currently   Other Topics Concern  . Not on file   Social History Narrative  . No narrative on file    Allergies  Allergen Reactions  . Etodolac     Outpatient Medications Prior to Visit  Medication Sig Dispense Refill  . aspirin 81 MG tablet Take 1 tablet (81 mg total) by mouth daily. 60 tablet 6  . amLODipine (NORVASC) 10 MG tablet Take 1 tablet (10 mg total) by mouth daily. 30 tablet 6  . ibuprofen (ADVIL,MOTRIN) 200 MG tablet Take 1 tablet (200 mg total) by mouth every 6 (six) hours as needed. 30 tablet 11  . meloxicam (MOBIC) 15 MG tablet Take 1 tablet by mouth daily.    Marland Kitchen omega-3 acid ethyl esters (LOVAZA) 1 g capsule Take 1 capsule (1 g total) by mouth 2 (two) times daily. 60 capsule 6   No facility-administered medications prior to visit.     Review of Systems  Constitutional: Negative for chills, fever, malaise/fatigue and weight loss.  HENT: Negative for ear discharge, ear pain and sore throat.   Eyes: Negative for blurred vision.  Respiratory: Negative for cough, sputum production, shortness  of breath and wheezing.   Cardiovascular: Negative for chest pain, palpitations, orthopnea, leg swelling and PND.  Gastrointestinal: Negative for abdominal pain, blood in stool, constipation, diarrhea, heartburn, melena and nausea.  Genitourinary: Negative for dysuria, frequency, hematuria and urgency.  Musculoskeletal: Negative for back pain, joint pain, myalgias and neck pain.  Skin: Negative for rash.  Neurological: Negative for dizziness, tingling, sensory change, focal weakness  and headaches.  Endo/Heme/Allergies: Negative for environmental allergies and polydipsia. Does not bruise/bleed easily.  Psychiatric/Behavioral: Negative for depression and suicidal ideas. The patient is not nervous/anxious and does not have insomnia.      Objective  Vitals:   10/16/16 0807  BP: 128/72  Pulse: 64  Weight: 204 lb (92.5 kg)  Height: 5\' 11"  (1.803 m)    Physical Exam  Constitutional: He is oriented to person, place, and time and well-developed, well-nourished, and in no distress.  HENT:  Head: Normocephalic.  Right Ear: External ear normal.  Left Ear: External ear normal.  Nose: Nose normal.  Mouth/Throat: Oropharynx is clear and moist.  Eyes: Conjunctivae and EOM are normal. Pupils are equal, round, and reactive to light. Right eye exhibits no discharge. Left eye exhibits no discharge. No scleral icterus.  Neck: Normal range of motion. Neck supple. No JVD present. No tracheal deviation present. No thyromegaly present.  Cardiovascular: Normal rate, regular rhythm, normal heart sounds and intact distal pulses.  Exam reveals no gallop and no friction rub.   No murmur heard. Pulmonary/Chest: Breath sounds normal. No respiratory distress. He has no wheezes. He has no rales.  Abdominal: Soft. Bowel sounds are normal. He exhibits no mass. There is no hepatosplenomegaly. There is no tenderness. There is no rebound, no guarding and no CVA tenderness.  Musculoskeletal: Normal range of motion. He exhibits no edema or tenderness.  Lymphadenopathy:    He has no cervical adenopathy.  Neurological: He is alert and oriented to person, place, and time. He has normal sensation, normal strength, normal reflexes and intact cranial nerves. No cranial nerve deficit.  Skin: Skin is warm. No rash noted.  Psychiatric: Mood and affect normal.  Nursing note and vitals reviewed.     Assessment & Plan  Problem List Items Addressed This Visit      Cardiovascular and Mediastinum    Essential hypertension - Primary   Relevant Medications   amLODipine (NORVASC) 10 MG tablet   omega-3 acid ethyl esters (LOVAZA) 1 g capsule     Musculoskeletal and Integument   Arthritis of both hands   Relevant Medications   ibuprofen (ADVIL,MOTRIN) 200 MG tablet     Other   Hyperlipidemia   Relevant Medications   amLODipine (NORVASC) 10 MG tablet   omega-3 acid ethyl esters (LOVAZA) 1 g capsule   Other Relevant Orders   Lipid Profile    Other Visit Diagnoses    Overweight (BMI 25.0-29.9)       Relevant Orders   Lipid Profile      Meds ordered this encounter  Medications  . amLODipine (NORVASC) 10 MG tablet    Sig: Take 1 tablet (10 mg total) by mouth daily.    Dispense:  30 tablet    Refill:  6    Sent in already for 30 days- MUST keep appt  . omega-3 acid ethyl esters (LOVAZA) 1 g capsule    Sig: Take 1 capsule (1 g total) by mouth 2 (two) times daily.    Dispense:  60 capsule    Refill:  6  .  ibuprofen (ADVIL,MOTRIN) 200 MG tablet    Sig: Take 1 tablet (200 mg total) by mouth every 6 (six) hours as needed.    Dispense:  30 tablet    Refill:  11      Dr. Littie Chiem [facility]  10/16/16

## 2016-10-17 LAB — LIPID PANEL
CHOL/HDL RATIO: 4.3 ratio (ref 0.0–5.0)
Cholesterol, Total: 209 mg/dL — ABNORMAL HIGH (ref 100–199)
HDL: 49 mg/dL (ref >39)
LDL Calculated: 132 mg/dL — ABNORMAL HIGH (ref 0–99)
Triglycerides: 139 mg/dL (ref 0–149)
VLDL Cholesterol Cal: 28 mg/dL (ref 5–40)

## 2016-10-18 DIAGNOSIS — M79672 Pain in left foot: Secondary | ICD-10-CM | POA: Diagnosis not present

## 2016-10-18 DIAGNOSIS — M722 Plantar fascial fibromatosis: Secondary | ICD-10-CM | POA: Diagnosis not present

## 2016-10-20 ENCOUNTER — Other Ambulatory Visit: Payer: Self-pay

## 2016-10-20 MED ORDER — ATORVASTATIN CALCIUM 20 MG PO TABS: 20.0000 mg | ORAL_TABLET | Freq: Every day | ORAL | 1 refills | Status: DC

## 2016-12-19 ENCOUNTER — Ambulatory Visit (INDEPENDENT_AMBULATORY_CARE_PROVIDER_SITE_OTHER): Payer: Medicare HMO

## 2016-12-19 ENCOUNTER — Ambulatory Visit
Admission: EM | Admit: 2016-12-19 | Discharge: 2016-12-19 | Disposition: A | Discharge status: Hospice | Payer: Medicare HMO | Attending: Family Medicine | Admitting: Family Medicine

## 2016-12-19 ENCOUNTER — Encounter: Payer: Self-pay | Admitting: *Deleted

## 2016-12-19 DIAGNOSIS — S52601B Unspecified fracture of lower end of right ulna, initial encounter for open fracture type I or II: Secondary | ICD-10-CM

## 2016-12-19 DIAGNOSIS — Z23 Encounter for immunization: Secondary | ICD-10-CM | POA: Diagnosis not present

## 2016-12-19 DIAGNOSIS — S4991XA Unspecified injury of right shoulder and upper arm, initial encounter: Secondary | ICD-10-CM

## 2016-12-19 DIAGNOSIS — W5512XA Struck by horse, initial encounter: Secondary | ICD-10-CM

## 2016-12-19 DIAGNOSIS — S52611A Displaced fracture of right ulna styloid process, initial encounter for closed fracture: Secondary | ICD-10-CM | POA: Diagnosis not present

## 2016-12-19 DIAGNOSIS — S52601A Unspecified fracture of lower end of right ulna, initial encounter for closed fracture: Secondary | ICD-10-CM | POA: Diagnosis not present

## 2016-12-19 DIAGNOSIS — G8918 Other acute postprocedural pain: Secondary | ICD-10-CM | POA: Diagnosis not present

## 2016-12-19 DIAGNOSIS — I10 Essential (primary) hypertension: Secondary | ICD-10-CM | POA: Diagnosis not present

## 2016-12-19 DIAGNOSIS — M25531 Pain in right wrist: Secondary | ICD-10-CM | POA: Diagnosis not present

## 2016-12-19 DIAGNOSIS — S52201C Unspecified fracture of shaft of right ulna, initial encounter for open fracture type IIIA, IIIB, or IIIC: Secondary | ICD-10-CM | POA: Diagnosis not present

## 2016-12-19 DIAGNOSIS — S52221A Displaced transverse fracture of shaft of right ulna, initial encounter for closed fracture: Secondary | ICD-10-CM | POA: Diagnosis not present

## 2016-12-19 DIAGNOSIS — Y33XXXA Other specified events, undetermined intent, initial encounter: Secondary | ICD-10-CM | POA: Diagnosis not present

## 2016-12-19 DIAGNOSIS — S52691B Other fracture of lower end of right ulna, initial encounter for open fracture type I or II: Secondary | ICD-10-CM | POA: Diagnosis not present

## 2016-12-19 DIAGNOSIS — S52591A Other fractures of lower end of right radius, initial encounter for closed fracture: Secondary | ICD-10-CM | POA: Diagnosis not present

## 2016-12-19 DIAGNOSIS — S52221B Displaced transverse fracture of shaft of right ulna, initial encounter for open fracture type I or II: Secondary | ICD-10-CM | POA: Diagnosis not present

## 2016-12-19 MED ORDER — TETANUS-DIPHTH-ACELL PERTUSSIS 5-2.5-18.5 LF-MCG/0.5 IM SUSP
0.5000 mL | Freq: Once | INTRAMUSCULAR | Status: AC
  Administered 2016-12-19: 0.5 mL via INTRAMUSCULAR

## 2016-12-19 NOTE — ED Provider Notes (Signed)
[facility]    CSN: [id number] Arrival date & time: 12/19/16  1505     History   Chief Complaint Chief Complaint  Patient presents with  . Arm Injury    HPI Angel Cummings. is a 71 y.o. male.   Patient is a 71 year old white male who was kicked by his granddaughters reports today. He states the horse reared up and kicked him. He states that he put his hands up because he was afraid that the horse was going to kick him in the face or the head and as he puts it better the arm than the head or face area no known drug allergies he has a history of arthritis hyperlipidemia and hypertension he's had colonoscopy hernia repair and knee surgery no previous trauma that he is aware of involving the right upper extremity. No pertinent past family medical history relevant to today's visit. He does have an open wound on his arm he never smoked and he is allergic to Lodine.   The history is provided by the patient. No language interpreter was used.  Arm Injury  Location:  Arm Arm location:  R forearm Pain details:    Quality:  Shooting and throbbing   Radiates to:  Does not radiate   Severity:  Moderate   Onset quality:  Sudden   Timing:  Constant Handedness:  Right-handed Dislocation: yes   Foreign body present:  No foreign bodies Tetanus status:  Unknown Prior injury to area:  No Relieved by:  Nothing Worsened by:  Bearing weight   Past Medical History:  Diagnosis Date  . Arthritis   . Hyperlipidemia   . Hypertension     Patient Active Problem List   Diagnosis Date Noted  . Arthritis of both hands 04/17/2016  . Arthritis of hand 04/17/2016  . Essential hypertension 06/01/2015  . Hyperlipidemia 06/01/2015    Past Surgical History:  Procedure Laterality Date  . COLONOSCOPY  2005   Dr Vickki Muff in [place] Left        Home Medications    Prior to Admission medications   Medication Sig Start Date End Date Taking?  Authorizing Provider  amLODipine (NORVASC) 10 MG tablet Take 1 tablet (10 mg total) by mouth daily. 10/16/16  Yes Juline Patch, MD  aspirin 81 MG tablet Take 1 tablet (81 mg total) by mouth daily. 06/01/15  Yes Juline Patch, MD  atorvastatin (LIPITOR) 20 MG tablet Take 1 tablet (20 mg total) by mouth daily. 10/20/16  Yes Juline Patch, MD  ibuprofen (ADVIL,MOTRIN) 200 MG tablet Take 1 tablet (200 mg total) by mouth every 6 (six) hours as needed. 10/16/16  Yes Juline Patch, MD  omega-3 acid ethyl esters (LOVAZA) 1 g capsule Take 1 capsule (1 g total) by mouth 2 (two) times daily. 10/16/16  Yes Juline Patch, MD    Family History History reviewed. No pertinent family history.  Social History Social History  Substance Use Topics  . Smoking status: Never Smoker  . Smokeless tobacco: Never Used  . Alcohol use 0.0 oz/week     Allergies   Etodolac   Review of Systems Review of Systems  Musculoskeletal: Positive for joint swelling and myalgias.  Skin: Positive for wound.  All other systems reviewed and are negative.    Physical Exam Triage Vital Signs ED Triage Vitals  Enc Vitals Group     BP 12/19/16 1520 (!) 176/81  Pulse Rate 12/19/16 1520 84     Resp 12/19/16 1520 16     Temp 12/19/16 1520 98.2 F (36.8 C)     Temp Source 12/19/16 1520 Oral     SpO2 12/19/16 1520 100 %     Weight 12/19/16 1523 204 lb (92.5 kg)     Height 12/19/16 1523 6' (1.829 m)     Head Circumference --      Peak Flow --      Pain Score 12/19/16 1524 8     Pain Loc --      Pain Edu? --      Excl. in [place]? --    No data found.   Updated Vital Signs BP (!) 176/81 (BP Location: Left Arm)   Pulse 84   Temp 98.2 F (36.8 C) (Oral)   Resp 16   Ht 6' (1.829 m)   Wt 204 lb (92.5 kg)   SpO2 100%   BMI 27.67 kg/m   Visual Acuity Right Eye Distance:   Left Eye Distance:   Bilateral Distance:    Right Eye Near:   Left Eye Near:    Bilateral Near:     Physical Exam    Constitutional: He is oriented to person, place, and time. He appears well-developed and well-nourished.  HENT:  Head: Normocephalic and atraumatic.  Right Ear: External ear normal.  Left Ear: External ear normal.  Eyes: Pupils are equal, round, and reactive to light.  Neck: Normal range of motion.  Pulmonary/Chest: Effort normal.  Musculoskeletal: He exhibits tenderness and deformity.       Arms: Abrasion on the ulnar side of the forearm  Neurological: He is alert and oriented to person, place, and time.  Skin: Skin is warm.  Psychiatric: He has a normal mood and affect.  Vitals reviewed.    UC Treatments / Results  Labs (all labs ordered are listed, but only abnormal results are displayed) Labs Reviewed - No data to display  EKG  EKG Interpretation None       Radiology Dg Forearm Right  Result Date: 12/19/2016 CLINICAL DATA:  Kicked by a horse with right forearm pain. Initial encounter. EXAM: RIGHT FOREARM - 2 VIEW COMPARISON:  None. FINDINGS: Given patient's condition, a true AP view could not be obtained. There is a transverse fracture of the distal ulnar metadiaphysis with 100% dorsal displacement. Soft tissue emphysema about the fracture suggesting open injury. Distal radiocarpal joint alignment is uncertain on these views. No additional fracture. IMPRESSION: 1. Displaced distal ulnar metadiaphysis fracture with soft tissue gas. 2. Limited positioning due to condition. Uncertain distal radioulnar joint alignment. Electronically Signed   By: Monte Fantasia M.D.   On: 12/19/2016 15:57    Procedures Procedures (including critical care time)  Medications Ordered in UC Medications  Tdap (BOOSTRIX) injection 0.5 mL (0.5 mLs Intramuscular Given 12/19/16 1535)     Initial Impression / Assessment and Plan / UC Course  I have reviewed the triage vital signs and the nursing notes.  Pertinent labs & imaging results that were available during my care of the patient were  reviewed by me and considered in my medical decision making (see chart for details).     Distal right ulnar displaced fracture with subcutaneous air on the x-ray. Tetanus was given patient wants to go to [facility] ED and will place him in a sling and send him to [facility] ED for evaluation and treatment  Final Clinical Impressions(s) / UC Diagnoses   Final  diagnoses:  Type I or II open fracture of distal end of right ulna, unspecified fracture morphology, initial encounter  Injury of right upper extremity, initial encounter    New Prescriptions Discharge Medication List as of 12/19/2016  4:22 PM     Note: This dictation was prepared with Dragon dictation along with smaller phrase technology. Any transcriptional errors that result from this process are unintentional.   Controlled Substance Prescriptions [place] Controlled Substance Registry consulted? Not Applicable   Frederich Cha, MD 12/19/16 [phone]

## 2016-12-19 NOTE — ED Triage Notes (Signed)
Pt kicked by horse a short while ago. Now c/o right forearm pain and edema.

## 2016-12-20 DIAGNOSIS — S52201B Unspecified fracture of shaft of right ulna, initial encounter for open fracture type I or II: Secondary | ICD-10-CM | POA: Insufficient documentation

## 2016-12-21 DIAGNOSIS — G8918 Other acute postprocedural pain: Secondary | ICD-10-CM | POA: Insufficient documentation

## 2017-01-03 DIAGNOSIS — S52201E Unspecified fracture of shaft of right ulna, subsequent encounter for open fracture type I or II with routine healing: Secondary | ICD-10-CM | POA: Diagnosis not present

## 2017-01-11 ENCOUNTER — Other Ambulatory Visit: Payer: Self-pay

## 2017-02-05 DIAGNOSIS — S52601D Unspecified fracture of lower end of right ulna, subsequent encounter for closed fracture with routine healing: Secondary | ICD-10-CM | POA: Diagnosis not present

## 2017-02-05 DIAGNOSIS — S52201E Unspecified fracture of shaft of right ulna, subsequent encounter for open fracture type I or II with routine healing: Secondary | ICD-10-CM | POA: Diagnosis not present

## 2017-02-05 DIAGNOSIS — W5512XD Struck by horse, subsequent encounter: Secondary | ICD-10-CM | POA: Diagnosis not present

## 2017-02-05 DIAGNOSIS — Y33XXXD Other specified events, undetermined intent, subsequent encounter: Secondary | ICD-10-CM | POA: Diagnosis not present

## 2017-02-05 DIAGNOSIS — M778 Other enthesopathies, not elsewhere classified: Secondary | ICD-10-CM | POA: Diagnosis not present

## 2017-02-20 DIAGNOSIS — M25531 Pain in right wrist: Secondary | ICD-10-CM | POA: Diagnosis not present

## 2017-02-26 DIAGNOSIS — M25531 Pain in right wrist: Secondary | ICD-10-CM | POA: Diagnosis not present

## 2017-03-01 DIAGNOSIS — M25531 Pain in right wrist: Secondary | ICD-10-CM | POA: Diagnosis not present

## 2017-03-06 DIAGNOSIS — M25531 Pain in right wrist: Secondary | ICD-10-CM | POA: Diagnosis not present

## 2017-03-09 DIAGNOSIS — M25531 Pain in right wrist: Secondary | ICD-10-CM | POA: Diagnosis not present

## 2017-03-16 DIAGNOSIS — M25531 Pain in right wrist: Secondary | ICD-10-CM | POA: Diagnosis not present

## 2017-03-20 DIAGNOSIS — W5512XD Struck by horse, subsequent encounter: Secondary | ICD-10-CM | POA: Diagnosis not present

## 2017-03-20 DIAGNOSIS — S52201E Unspecified fracture of shaft of right ulna, subsequent encounter for open fracture type I or II with routine healing: Secondary | ICD-10-CM | POA: Diagnosis not present

## 2017-03-20 DIAGNOSIS — Z9889 Other specified postprocedural states: Secondary | ICD-10-CM | POA: Diagnosis not present

## 2017-03-20 DIAGNOSIS — S52601D Unspecified fracture of lower end of right ulna, subsequent encounter for closed fracture with routine healing: Secondary | ICD-10-CM | POA: Diagnosis not present

## 2017-03-20 DIAGNOSIS — Y33XXXD Other specified events, undetermined intent, subsequent encounter: Secondary | ICD-10-CM | POA: Diagnosis not present

## 2017-03-20 DIAGNOSIS — R600 Localized edema: Secondary | ICD-10-CM | POA: Diagnosis not present

## 2017-03-28 DIAGNOSIS — M25531 Pain in right wrist: Secondary | ICD-10-CM | POA: Diagnosis not present

## 2017-04-04 DIAGNOSIS — M25531 Pain in right wrist: Secondary | ICD-10-CM | POA: Diagnosis not present

## 2017-04-11 DIAGNOSIS — M25531 Pain in right wrist: Secondary | ICD-10-CM | POA: Diagnosis not present

## 2017-04-18 DIAGNOSIS — M25531 Pain in right wrist: Secondary | ICD-10-CM | POA: Diagnosis not present

## 2017-04-25 DIAGNOSIS — M25531 Pain in right wrist: Secondary | ICD-10-CM | POA: Diagnosis not present

## 2017-05-04 DIAGNOSIS — M25531 Pain in right wrist: Secondary | ICD-10-CM | POA: Diagnosis not present

## 2017-05-10 DIAGNOSIS — M531 Cervicobrachial syndrome: Secondary | ICD-10-CM | POA: Diagnosis not present

## 2017-05-10 DIAGNOSIS — M25531 Pain in right wrist: Secondary | ICD-10-CM | POA: Diagnosis not present

## 2017-05-14 DIAGNOSIS — M531 Cervicobrachial syndrome: Secondary | ICD-10-CM | POA: Diagnosis not present

## 2017-05-14 DIAGNOSIS — M25531 Pain in right wrist: Secondary | ICD-10-CM | POA: Diagnosis not present

## 2017-05-16 ENCOUNTER — Ambulatory Visit: Payer: Medicare HMO | Admitting: Family Medicine

## 2017-05-16 DIAGNOSIS — M25531 Pain in right wrist: Secondary | ICD-10-CM | POA: Diagnosis not present

## 2017-05-16 DIAGNOSIS — M531 Cervicobrachial syndrome: Secondary | ICD-10-CM | POA: Diagnosis not present

## 2017-05-17 ENCOUNTER — Encounter: Payer: Self-pay | Admitting: Family Medicine

## 2017-05-17 ENCOUNTER — Ambulatory Visit (INDEPENDENT_AMBULATORY_CARE_PROVIDER_SITE_OTHER): Payer: Medicare HMO | Admitting: Family Medicine

## 2017-05-17 VITALS — BP 138/80 | HR 80 | Ht 72.0 in | Wt 212.0 lb

## 2017-05-17 DIAGNOSIS — M19042 Primary osteoarthritis, left hand: Secondary | ICD-10-CM | POA: Diagnosis not present

## 2017-05-17 DIAGNOSIS — R69 Illness, unspecified: Secondary | ICD-10-CM | POA: Diagnosis not present

## 2017-05-17 DIAGNOSIS — E782 Mixed hyperlipidemia: Secondary | ICD-10-CM | POA: Diagnosis not present

## 2017-05-17 DIAGNOSIS — I10 Essential (primary) hypertension: Secondary | ICD-10-CM

## 2017-05-17 DIAGNOSIS — M19041 Primary osteoarthritis, right hand: Secondary | ICD-10-CM

## 2017-05-17 DIAGNOSIS — Z23 Encounter for immunization: Secondary | ICD-10-CM

## 2017-05-17 MED ORDER — ATORVASTATIN CALCIUM 20 MG PO TABS: 20.0000 mg | ORAL_TABLET | Freq: Every day | ORAL | 6 refills | Status: DC

## 2017-05-17 MED ORDER — AMLODIPINE BESYLATE 10 MG PO TABS: 10.0000 mg | ORAL_TABLET | Freq: Every day | ORAL | 6 refills | Status: DC

## 2017-05-17 MED ORDER — IBUPROFEN 200 MG PO TABS: 200.0000 mg | ORAL_TABLET | Freq: Four times a day (QID) | ORAL | 11 refills | Status: DC | PRN

## 2017-05-17 NOTE — Progress Notes (Signed)
Name: Angel Cummings.   MRN: [id number]    DOB: 1945/05/13   Date:05/17/2017       Progress Note  Subjective  Chief Complaint  Chief Complaint  Patient presents with  . Hypertension  . Hyperlipidemia    Hypertension  This is a chronic problem. The current episode started more than 1 year ago. The problem is unchanged. The problem is controlled. Pertinent negatives include no anxiety, blurred vision, chest pain, headaches, malaise/fatigue, neck pain, orthopnea, palpitations, peripheral edema, PND, shortness of breath or sweats. There are no associated agents to hypertension. Risk factors for coronary artery disease include dyslipidemia and male gender. Past treatments include calcium channel blockers. The current treatment provides moderate improvement. There are no compliance problems.  There is no history of angina, kidney disease, CAD/MI, CVA, heart failure, left ventricular hypertrophy, PVD or retinopathy. There is no history of chronic renal disease, a hypertension causing med or renovascular disease.  Hyperlipidemia  This is a chronic problem. The current episode started more than 1 year ago. The problem is controlled. Recent lipid tests were reviewed and are variable. He has no history of chronic renal disease, diabetes, hypothyroidism, liver disease, obesity or nephrotic syndrome. There are no known factors aggravating his hyperlipidemia. Pertinent negatives include no chest pain, focal sensory loss, focal weakness, leg pain, myalgias or shortness of breath. Current antihyperlipidemic treatment includes statins. The current treatment provides mild improvement of lipids. There are no compliance problems.  Risk factors for coronary artery disease include dyslipidemia.  Arm Pain   The incident occurred more than 1 week ago (radius fx). The incident occurred at home. The injury mechanism was a direct blow. The pain is present in the right fingers and right forearm. The pain is mild. Pertinent  negatives include no chest pain or tingling.    No problem-specific Assessment & Plan notes found for this encounter.   Past Medical History:  Diagnosis Date  . Arthritis   . Hyperlipidemia   . Hypertension     Past Surgical History:  Procedure Laterality Date  . COLONOSCOPY  2005   Dr Vickki Muff in [place] Left     History reviewed. No pertinent family history.  Social History   Socioeconomic History  . Marital status: Single    Spouse name: Not on file  . Number of children: Not on file  . Years of education: Not on file  . Highest education level: Not on file  Social Needs  . Financial resource strain: Not on file  . Food insecurity - worry: Not on file  . Food insecurity - inability: Not on file  . Transportation needs - medical: Not on file  . Transportation needs - non-medical: Not on file  Occupational History  . Not on file  Tobacco Use  . Smoking status: Never Smoker  . Smokeless tobacco: Never Used  Substance and Sexual Activity  . Alcohol use: Yes    Alcohol/week: 0.0 oz  . Drug use: No  . Sexual activity: Not Currently  Other Topics Concern  . Not on file  Social History Narrative  . Not on file    Allergies  Allergen Reactions  . Etodolac     Outpatient Medications Prior to Visit  Medication Sig Dispense Refill  . aspirin 81 MG tablet Take 1 tablet (81 mg total) by mouth daily. 60 tablet 6  . amLODipine (NORVASC) 10 MG tablet Take 1 tablet (10 mg total)  by mouth daily. 30 tablet 6  . ibuprofen (ADVIL,MOTRIN) 200 MG tablet Take 1 tablet (200 mg total) by mouth every 6 (six) hours as needed. 30 tablet 11  . omega-3 acid ethyl esters (LOVAZA) 1 g capsule Take 1 capsule (1 g total) by mouth 2 (two) times daily. 60 capsule 6  . atorvastatin (LIPITOR) 20 MG tablet Take 1 tablet (20 mg total) by mouth daily. (Patient not taking: Reported on 05/17/2017) 30 tablet 1   No facility-administered medications prior to  visit.     Review of Systems  Constitutional: Negative for chills, fever, malaise/fatigue and weight loss.  HENT: Negative for ear discharge, ear pain and sore throat.   Eyes: Negative for blurred vision.  Respiratory: Negative for cough, sputum production, shortness of breath and wheezing.   Cardiovascular: Negative for chest pain, palpitations, orthopnea, leg swelling and PND.  Gastrointestinal: Negative for abdominal pain, blood in stool, constipation, diarrhea, heartburn, melena and nausea.  Genitourinary: Negative for dysuria, frequency, hematuria and urgency.  Musculoskeletal: Negative for back pain, joint pain, myalgias and neck pain.  Skin: Negative for rash.  Neurological: Negative for dizziness, tingling, sensory change, focal weakness and headaches.  Endo/Heme/Allergies: Negative for environmental allergies and polydipsia. Does not bruise/bleed easily.  Psychiatric/Behavioral: Negative for depression and suicidal ideas. The patient is not nervous/anxious and does not have insomnia.      Objective  Vitals:   05/17/17 0816  BP: 138/80  Pulse: 80  Weight: 212 lb (96.2 kg)  Height: 6' (1.829 m)    Physical Exam  Constitutional: He is oriented to person, place, and time and well-developed, well-nourished, and in no distress.  HENT:  Head: Normocephalic.  Right Ear: External ear normal.  Left Ear: External ear normal.  Nose: Nose normal.  Mouth/Throat: Oropharynx is clear and moist.  Eyes: Conjunctivae and EOM are normal. Pupils are equal, round, and reactive to light. Right eye exhibits no discharge. Left eye exhibits no discharge. No scleral icterus.  Neck: Normal range of motion. Neck supple. No JVD present. No tracheal deviation present. No thyromegaly present.  Cardiovascular: Normal rate, regular rhythm, normal heart sounds and intact distal pulses. Exam reveals no gallop and no friction rub.  No murmur heard. Pulmonary/Chest: Breath sounds normal. No respiratory  distress. He has no wheezes. He has no rales.  Abdominal: Soft. Bowel sounds are normal. He exhibits no mass. There is no hepatosplenomegaly. There is no tenderness. There is no rebound, no guarding and no CVA tenderness.  Musculoskeletal: Normal range of motion. He exhibits no edema or tenderness.  Lymphadenopathy:    He has no cervical adenopathy.  Neurological: He is alert and oriented to person, place, and time. He has normal sensation, normal strength and intact cranial nerves. No cranial nerve deficit.  Skin: Skin is warm. No rash noted.  Psychiatric: Mood and affect normal.  Nursing note and vitals reviewed.     Assessment & Plan  Problem List Items Addressed This Visit      Cardiovascular and Mediastinum   Essential hypertension   Relevant Medications   amLODipine (NORVASC) 10 MG tablet   atorvastatin (LIPITOR) 20 MG tablet     Musculoskeletal and Integument   Arthritis of both hands   Relevant Medications   ibuprofen (ADVIL,MOTRIN) 200 MG tablet     Other   Hyperlipidemia   Relevant Medications   amLODipine (NORVASC) 10 MG tablet   atorvastatin (LIPITOR) 20 MG tablet    Other Visit Diagnoses    Need for  23-polyvalent pneumococcal polysaccharide vaccine    -  Primary   Relevant Orders   Pneumococcal polysaccharide vaccine 23-valent greater than or equal to 2yo subcutaneous/IM (Completed)   Taking medication for chronic disease          Meds ordered this encounter  Medications  . amLODipine (NORVASC) 10 MG tablet    Sig: Take 1 tablet (10 mg total) by mouth daily.    Dispense:  30 tablet    Refill:  6    Sent in already for 30 days- MUST keep appt  . ibuprofen (ADVIL,MOTRIN) 200 MG tablet    Sig: Take 1 tablet (200 mg total) by mouth every 6 (six) hours as needed.    Dispense:  30 tablet    Refill:  11  . atorvastatin (LIPITOR) 20 MG tablet    Sig: Take 1 tablet (20 mg total) by mouth daily.    Dispense:  30 tablet    Refill:  6      Dr. Otilio Miu [facility]  05/17/17

## 2017-05-22 DIAGNOSIS — M25531 Pain in right wrist: Secondary | ICD-10-CM | POA: Diagnosis not present

## 2017-05-22 DIAGNOSIS — M531 Cervicobrachial syndrome: Secondary | ICD-10-CM | POA: Diagnosis not present

## 2017-05-25 DIAGNOSIS — M25531 Pain in right wrist: Secondary | ICD-10-CM | POA: Diagnosis not present

## 2017-05-25 DIAGNOSIS — M531 Cervicobrachial syndrome: Secondary | ICD-10-CM | POA: Diagnosis not present

## 2017-05-30 DIAGNOSIS — M25531 Pain in right wrist: Secondary | ICD-10-CM | POA: Diagnosis not present

## 2017-05-30 DIAGNOSIS — M531 Cervicobrachial syndrome: Secondary | ICD-10-CM | POA: Diagnosis not present

## 2017-06-01 DIAGNOSIS — M25531 Pain in right wrist: Secondary | ICD-10-CM | POA: Diagnosis not present

## 2017-06-01 DIAGNOSIS — M531 Cervicobrachial syndrome: Secondary | ICD-10-CM | POA: Diagnosis not present

## 2017-06-21 DIAGNOSIS — Z85828 Personal history of other malignant neoplasm of skin: Secondary | ICD-10-CM | POA: Diagnosis not present

## 2017-06-21 DIAGNOSIS — G8929 Other chronic pain: Secondary | ICD-10-CM | POA: Diagnosis not present

## 2017-06-21 DIAGNOSIS — Z803 Family history of malignant neoplasm of breast: Secondary | ICD-10-CM | POA: Diagnosis not present

## 2017-06-21 DIAGNOSIS — Z809 Family history of malignant neoplasm, unspecified: Secondary | ICD-10-CM | POA: Diagnosis not present

## 2017-06-21 DIAGNOSIS — M1990 Unspecified osteoarthritis, unspecified site: Secondary | ICD-10-CM | POA: Diagnosis not present

## 2017-06-21 DIAGNOSIS — I10 Essential (primary) hypertension: Secondary | ICD-10-CM | POA: Diagnosis not present

## 2017-06-21 DIAGNOSIS — Z8249 Family history of ischemic heart disease and other diseases of the circulatory system: Secondary | ICD-10-CM | POA: Diagnosis not present

## 2017-06-21 DIAGNOSIS — Z791 Long term (current) use of non-steroidal anti-inflammatories (NSAID): Secondary | ICD-10-CM | POA: Diagnosis not present

## 2017-06-21 DIAGNOSIS — G629 Polyneuropathy, unspecified: Secondary | ICD-10-CM | POA: Diagnosis not present

## 2017-06-21 DIAGNOSIS — Z823 Family history of stroke: Secondary | ICD-10-CM | POA: Diagnosis not present

## 2017-06-22 ENCOUNTER — Other Ambulatory Visit: Payer: Self-pay

## 2017-06-22 DIAGNOSIS — S52201E Unspecified fracture of shaft of right ulna, subsequent encounter for open fracture type I or II with routine healing: Secondary | ICD-10-CM | POA: Diagnosis not present

## 2017-06-22 DIAGNOSIS — Z79899 Other long term (current) drug therapy: Secondary | ICD-10-CM | POA: Diagnosis not present

## 2017-06-22 DIAGNOSIS — W5512XD Struck by horse, subsequent encounter: Secondary | ICD-10-CM | POA: Diagnosis not present

## 2017-06-25 ENCOUNTER — Encounter: Payer: Self-pay | Admitting: Family Medicine

## 2017-06-25 ENCOUNTER — Ambulatory Visit (INDEPENDENT_AMBULATORY_CARE_PROVIDER_SITE_OTHER): Payer: Medicare HMO | Admitting: Family Medicine

## 2017-06-25 VITALS — BP 138/82 | HR 64 | Ht 72.0 in | Wt 213.0 lb

## 2017-06-25 DIAGNOSIS — E782 Mixed hyperlipidemia: Secondary | ICD-10-CM | POA: Diagnosis not present

## 2017-06-25 DIAGNOSIS — M19041 Primary osteoarthritis, right hand: Secondary | ICD-10-CM

## 2017-06-25 DIAGNOSIS — I10 Essential (primary) hypertension: Secondary | ICD-10-CM

## 2017-06-25 DIAGNOSIS — I499 Cardiac arrhythmia, unspecified: Secondary | ICD-10-CM | POA: Diagnosis not present

## 2017-06-25 DIAGNOSIS — M19042 Primary osteoarthritis, left hand: Secondary | ICD-10-CM | POA: Diagnosis not present

## 2017-06-25 MED ORDER — IBUPROFEN 200 MG PO TABS: 200.0000 mg | ORAL_TABLET | Freq: Four times a day (QID) | ORAL | 11 refills | Status: DC | PRN

## 2017-06-25 MED ORDER — AMLODIPINE BESYLATE 10 MG PO TABS: 10.0000 mg | ORAL_TABLET | Freq: Every day | ORAL | 6 refills | Status: DC

## 2017-06-25 NOTE — Progress Notes (Signed)
Name: Angel Cummings.   MRN: [id number]    DOB: [birth year]   Date:06/25/2017       Progress Note  Subjective  Chief Complaint  Chief Complaint  Patient presents with  . Hypertension  . Hyperlipidemia    stopped Lipitor d/t indigestion- stopped taking med and it subsided  . Arthritis    takes Ibuprofen for this    Hypertension  This is a chronic problem. The current episode started more than 1 year ago. The problem is unchanged. The problem is controlled. Pertinent negatives include no anxiety, blurred vision, chest pain, headaches, malaise/fatigue, neck pain, orthopnea, palpitations, peripheral edema, PND, shortness of breath or sweats. There are no associated agents to hypertension. Risk factors for coronary artery disease include dyslipidemia and post-menopausal state. Past treatments include calcium channel blockers. The current treatment provides moderate improvement. Compliance problems include medication side effects (atorvastatin).  There is no history of angina, kidney disease, CAD/MI, CVA, heart failure, left ventricular hypertrophy, PVD or retinopathy. There is no history of chronic renal disease, a hypertension causing med or renovascular disease.  Hyperlipidemia  This is a chronic problem. The current episode started more than 1 year ago. The problem is uncontrolled. Recent lipid tests were reviewed and are variable. He has no history of chronic renal disease, diabetes or hypothyroidism. Pertinent negatives include no chest pain, focal sensory loss, focal weakness, leg pain, myalgias or shortness of breath. (Heartburn) The current treatment provides moderate improvement of lipids. There are no compliance problems.  Risk factors for coronary artery disease include hypertension, dyslipidemia, male sex and post-menopausal.  Arthritis  Presents for follow-up visit. The symptoms have been stable. Affected location: hands. Pertinent negatives include no diarrhea, dry eyes, dry mouth,  dysuria, fatigue, fever, pain at night, pain while resting, rash or weight loss.    No problem-specific Assessment & Plan notes found for this encounter.   Past Medical History:  Diagnosis Date  . Arthritis   . Hyperlipidemia   . Hypertension     Past Surgical History:  Procedure Laterality Date  . COLONOSCOPY  2005   Dr Vickki Muff in [place] Left     History reviewed. No pertinent family history.  Social History   Socioeconomic History  . Marital status: Single    Spouse name: Not on file  . Number of children: Not on file  . Years of education: Not on file  . Highest education level: Not on file  Social Needs  . Financial resource strain: Not on file  . Food insecurity - worry: Not on file  . Food insecurity - inability: Not on file  . Transportation needs - medical: Not on file  . Transportation needs - non-medical: Not on file  Occupational History  . Not on file  Tobacco Use  . Smoking status: Never Smoker  . Smokeless tobacco: Never Used  Substance and Sexual Activity  . Alcohol use: Yes    Alcohol/week: 0.0 oz  . Drug use: No  . Sexual activity: Not Currently  Other Topics Concern  . Not on file  Social History Narrative  . Not on file    Allergies  Allergen Reactions  . Etodolac     Outpatient Medications Prior to Visit  Medication Sig Dispense Refill  . aspirin 81 MG tablet Take 1 tablet (81 mg total) by mouth daily. 60 tablet 6  . amLODipine (NORVASC) 10 MG tablet Take 1 tablet (10 mg total)  by mouth daily. 30 tablet 6  . ibuprofen (ADVIL,MOTRIN) 200 MG tablet Take 1 tablet (200 mg total) by mouth every 6 (six) hours as needed. 30 tablet 11  . atorvastatin (LIPITOR) 20 MG tablet Take 1 tablet (20 mg total) by mouth daily. (Patient not taking: Reported on 06/25/2017) 30 tablet 6   No facility-administered medications prior to visit.     Review of Systems  Constitutional: Negative for chills, fatigue, fever,  malaise/fatigue and weight loss.  HENT: Negative for ear discharge, ear pain and sore throat.   Eyes: Negative for blurred vision.  Respiratory: Negative for cough, sputum production, shortness of breath and wheezing.   Cardiovascular: Negative for chest pain, palpitations, orthopnea, leg swelling and PND.  Gastrointestinal: Negative for abdominal pain, blood in stool, constipation, diarrhea, heartburn, melena and nausea.  Genitourinary: Negative for dysuria, frequency, hematuria and urgency.  Musculoskeletal: Positive for arthritis. Negative for back pain, joint pain, myalgias and neck pain.  Skin: Negative for rash.  Neurological: Negative for dizziness, tingling, sensory change, focal weakness and headaches.  Endo/Heme/Allergies: Negative for environmental allergies and polydipsia. Does not bruise/bleed easily.  Psychiatric/Behavioral: Negative for depression and suicidal ideas. The patient is not nervous/anxious and does not have insomnia.      Objective  Vitals:   06/25/17 0813  BP: 138/82  Pulse: 64  Weight: 213 lb (96.6 kg)  Height: 6' (1.829 m)    Physical Exam  Constitutional: He is oriented to person, place, and time and well-developed, well-nourished, and in no distress.  HENT:  Head: Normocephalic.  Right Ear: External ear normal.  Left Ear: External ear normal.  Nose: Nose normal.  Mouth/Throat: Oropharynx is clear and moist.  Eyes: Conjunctivae and EOM are normal. Pupils are equal, round, and reactive to light. Right eye exhibits no discharge. Left eye exhibits no discharge. No scleral icterus.  Neck: Normal range of motion. Neck supple. No JVD present. No tracheal deviation present. No thyromegaly present.  Cardiovascular: Normal rate, regular rhythm, normal heart sounds and intact distal pulses. Exam reveals no gallop and no friction rub.  No murmur heard. Pulmonary/Chest: Breath sounds normal. No respiratory distress. He has no wheezes. He has no rales.   Abdominal: Soft. Bowel sounds are normal. He exhibits no mass. There is no hepatosplenomegaly. There is no tenderness. There is no rebound, no guarding and no CVA tenderness.  Musculoskeletal: Normal range of motion. He exhibits no edema or tenderness.  Lymphadenopathy:    He has no cervical adenopathy.  Neurological: He is alert and oriented to person, place, and time. He has normal sensation, normal strength, normal reflexes and intact cranial nerves. No cranial nerve deficit.  Skin: Skin is warm. No rash noted.  Psychiatric: Mood and affect normal.  Nursing note and vitals reviewed.     Assessment & Plan  Problem List Items Addressed This Visit      Cardiovascular and Mediastinum   Essential hypertension - Primary   Relevant Medications   amLODipine (NORVASC) 10 MG tablet   Other Relevant Orders   Renal function panel     Musculoskeletal and Integument   Arthritis of both hands   Relevant Medications   ibuprofen (ADVIL,MOTRIN) 200 MG tablet     Other   Hyperlipidemia   Relevant Medications   amLODipine (NORVASC) 10 MG tablet   Other Relevant Orders   Lipid panel    Other Visit Diagnoses    Pulse regularly irregular       NEW   Relevant Orders  EKG 12-Lead (Completed)      Meds ordered this encounter  Medications  . amLODipine (NORVASC) 10 MG tablet    Sig: Take 1 tablet (10 mg total) by mouth daily.    Dispense:  30 tablet    Refill:  6    Sent in already for 30 days- MUST keep appt  . ibuprofen (ADVIL,MOTRIN) 200 MG tablet    Sig: Take 1 tablet (200 mg total) by mouth every 6 (six) hours as needed.    Dispense:  30 tablet    Refill:  11      Dr. Eros Montour [facility]  06/25/17

## 2017-06-26 LAB — RENAL FUNCTION PANEL
Albumin: 4.4 g/dL (ref 3.6–4.8)
BUN / CREAT RATIO: 20 (ref 10–24)
BUN: 16 mg/dL (ref 8–27)
CALCIUM: 8.9 mg/dL (ref 8.6–10.2)
CO2: 23 mmol/L (ref 20–29)
CREATININE: 0.82 mg/dL (ref 0.76–1.27)
Chloride: 104 mmol/L (ref 96–106)
GFR calc Af Amer: 104 mL/min/{1.73_m2} (ref >59)
GFR, EST NON AFRICAN AMERICAN: 90 mL/min/{1.73_m2} (ref >59)
GLUCOSE: 90 mg/dL (ref 65–99)
PHOSPHORUS: 3 mg/dL (ref 2.5–4.5)
POTASSIUM: 4 mmol/L (ref 3.5–5.2)
SODIUM: 143 mmol/L (ref 134–144)

## 2017-06-26 LAB — LIPID PANEL
CHOL/HDL RATIO: 5 ratio (ref 0.0–5.0)
Cholesterol, Total: 214 mg/dL — ABNORMAL HIGH (ref 100–199)
HDL: 43 mg/dL (ref >39)
LDL Calculated: 134 mg/dL — ABNORMAL HIGH (ref 0–99)
Triglycerides: 187 mg/dL — ABNORMAL HIGH (ref 0–149)
VLDL Cholesterol Cal: 37 mg/dL (ref 5–40)

## 2017-07-17 DIAGNOSIS — G5621 Lesion of ulnar nerve, right upper limb: Secondary | ICD-10-CM | POA: Diagnosis not present

## 2017-07-17 DIAGNOSIS — M5412 Radiculopathy, cervical region: Secondary | ICD-10-CM | POA: Diagnosis not present

## 2017-07-18 DIAGNOSIS — G5621 Lesion of ulnar nerve, right upper limb: Secondary | ICD-10-CM | POA: Insufficient documentation

## 2017-07-18 DIAGNOSIS — M5412 Radiculopathy, cervical region: Secondary | ICD-10-CM | POA: Insufficient documentation

## 2017-08-14 DIAGNOSIS — M5412 Radiculopathy, cervical region: Secondary | ICD-10-CM | POA: Diagnosis not present

## 2017-08-14 DIAGNOSIS — G5621 Lesion of ulnar nerve, right upper limb: Secondary | ICD-10-CM | POA: Diagnosis not present

## 2017-08-14 DIAGNOSIS — G5603 Carpal tunnel syndrome, bilateral upper limbs: Secondary | ICD-10-CM | POA: Diagnosis not present

## 2017-10-23 ENCOUNTER — Other Ambulatory Visit: Payer: Self-pay

## 2017-10-23 DIAGNOSIS — M62830 Muscle spasm of back: Secondary | ICD-10-CM

## 2017-10-23 MED ORDER — CYCLOBENZAPRINE HCL 10 MG PO TABS: 10.0000 mg | ORAL_TABLET | Freq: Three times a day (TID) | ORAL | 0 refills | Status: DC | PRN

## 2017-10-24 ENCOUNTER — Encounter: Payer: Self-pay | Admitting: Family Medicine

## 2017-10-24 ENCOUNTER — Ambulatory Visit (INDEPENDENT_AMBULATORY_CARE_PROVIDER_SITE_OTHER): Payer: Medicare HMO | Admitting: Family Medicine

## 2017-10-24 VITALS — BP 130/78 | HR 76 | Ht 72.0 in | Wt 210.0 lb

## 2017-10-24 DIAGNOSIS — M5116 Intervertebral disc disorders with radiculopathy, lumbar region: Secondary | ICD-10-CM

## 2017-10-24 MED ORDER — TRAMADOL HCL 50 MG PO TABS: 50.0000 mg | ORAL_TABLET | Freq: Four times a day (QID) | ORAL | 0 refills | Status: DC | PRN

## 2017-10-24 MED ORDER — PREDNISONE 10 MG PO TABS: 10.0000 mg | ORAL_TABLET | Freq: Every day | ORAL | 0 refills | Status: DC

## 2017-10-24 NOTE — Progress Notes (Signed)
Name: Angel Cummings.   MRN: [id number]    DOB: 01/13/1946   Date:10/24/2017       Progress Note  Subjective  Chief Complaint  Chief Complaint  Patient presents with  . Back Pain    was in back of truck opening bag of ice to pour in cooler. Pulled bag open and back felt like it was "a catch in the left side" Flexeril was called in yesterday, helps for a bit then the back starts to hurt again or "catches"    Back Pain  This is a new problem. The current episode started in the past 7 days (Friday). The problem occurs constantly. The problem has been gradually worsening since onset. The pain is present in the lumbar spine. The quality of the pain is described as aching. The pain radiates to the left thigh (L buttock). The pain is at a severity of 2/10. The pain is moderate. The symptoms are aggravated by bending, twisting and coughing. Associated symptoms include leg pain. Pertinent negatives include no abdominal pain, bladder incontinence, bowel incontinence, chest pain, dysuria, fever, headaches, numbness, paresis, paresthesias, tingling or weight loss.    No problem-specific Assessment & Plan notes found for this encounter.   Past Medical History:  Diagnosis Date  . Arthritis   . Hyperlipidemia   . Hypertension     Past Surgical History:  Procedure Laterality Date  . COLONOSCOPY  2005   Dr Vickki Muff in [place] Left     No family history on file.  Social History   Socioeconomic History  . Marital status: Single    Spouse name: Not on file  . Number of children: Not on file  . Years of education: Not on file  . Highest education level: Not on file  Occupational History  . Not on file  Social Needs  . Financial resource strain: Not on file  . Food insecurity:    Worry: Not on file    Inability: Not on file  . Transportation needs:    Medical: Not on file    Non-medical: Not on file  Tobacco Use  . Smoking status: Never Smoker  .  Smokeless tobacco: Never Used  Substance and Sexual Activity  . Alcohol use: Yes    Alcohol/week: 0.0 oz  . Drug use: No  . Sexual activity: Not Currently  Lifestyle  . Physical activity:    Days per week: Not on file    Minutes per session: Not on file  . Stress: Not on file  Relationships  . Social connections:    Talks on phone: Not on file    Gets together: Not on file    Attends religious service: Not on file    Active member of club or organization: Not on file    Attends meetings of clubs or organizations: Not on file    Relationship status: Not on file  . Intimate partner violence:    Fear of current or ex partner: Not on file    Emotionally abused: Not on file    Physically abused: Not on file    Forced sexual activity: Not on file  Other Topics Concern  . Not on file  Social History Narrative  . Not on file    Allergies  Allergen Reactions  . Etodolac     Outpatient Medications Prior to Visit  Medication Sig Dispense Refill  . amLODipine (NORVASC) 10 MG tablet Take 1  tablet (10 mg total) by mouth daily. 30 tablet 6  . aspirin 81 MG tablet Take 1 tablet (81 mg total) by mouth daily. 60 tablet 6  . cyclobenzaprine (FLEXERIL) 10 MG tablet Take 1 tablet (10 mg total) by mouth 3 (three) times daily as needed for muscle spasms. 30 tablet 0  . ibuprofen (ADVIL,MOTRIN) 200 MG tablet Take 1 tablet (200 mg total) by mouth every 6 (six) hours as needed. 30 tablet 11   No facility-administered medications prior to visit.     Review of Systems  Constitutional: Negative for chills, fever, malaise/fatigue and weight loss.  HENT: Negative for ear discharge, ear pain and sore throat.   Eyes: Negative for blurred vision.  Respiratory: Negative for cough, sputum production, shortness of breath and wheezing.   Cardiovascular: Negative for chest pain, palpitations and leg swelling.  Gastrointestinal: Negative for abdominal pain, blood in stool, bowel incontinence,  constipation, diarrhea, heartburn, melena and nausea.  Genitourinary: Negative for bladder incontinence, dysuria, frequency, hematuria and urgency.  Musculoskeletal: Positive for back pain. Negative for falls, joint pain, myalgias and neck pain.  Skin: Negative for rash.  Neurological: Negative for dizziness, tingling, sensory change, focal weakness, numbness, headaches and paresthesias.  Endo/Heme/Allergies: Negative for environmental allergies and polydipsia. Does not bruise/bleed easily.  Psychiatric/Behavioral: Negative for depression and suicidal ideas. The patient is not nervous/anxious and does not have insomnia.      Objective  Vitals:   10/24/17 0810  BP: 130/78  Pulse: 76  Weight: 210 lb (95.3 kg)  Height: 6' (1.829 m)    Physical Exam  Constitutional: He is oriented to person, place, and time.  HENT:  Head: Normocephalic.  Right Ear: External ear normal.  Left Ear: External ear normal.  Nose: Nose normal.  Mouth/Throat: Oropharynx is clear and moist.  Eyes: Pupils are equal, round, and reactive to light. Conjunctivae and EOM are normal. Right eye exhibits no discharge. Left eye exhibits no discharge. No scleral icterus.  Neck: Normal range of motion. Neck supple. No JVD present. No tracheal deviation present. No thyromegaly present.  Cardiovascular: Normal rate, regular rhythm, normal heart sounds and intact distal pulses. Exam reveals no gallop and no friction rub.  No murmur heard. Pulmonary/Chest: Breath sounds normal. No respiratory distress. He has no wheezes. He has no rales.  Abdominal: Soft. Bowel sounds are normal. He exhibits no mass. There is no hepatosplenomegaly. There is no tenderness. There is no rebound, no guarding and no CVA tenderness.  Musculoskeletal: He exhibits no edema or tenderness.       Lumbar back: He exhibits decreased range of motion and spasm. He exhibits no tenderness and no bony tenderness.  Lymphadenopathy:    He has no cervical  adenopathy.  Neurological: He is alert and oriented to person, place, and time. He has normal strength and normal reflexes. He displays normal reflexes. No cranial nerve deficit or sensory deficit. He exhibits normal muscle tone. Coordination and gait normal.  Reflex Scores:      Tricep reflexes are 2+ on the right side and 2+ on the left side.      Bicep reflexes are 2+ on the right side and 2+ on the left side.      Brachioradialis reflexes are 2+ on the right side and 2+ on the left side.      Patellar reflexes are 2+ on the right side and 2+ on the left side.      Achilles reflexes are 2+ on the right side and  2+ on the left side. N0 SLE  Skin: Skin is warm. No rash noted.  Nursing note and vitals reviewed.     Assessment & Plan  Problem List Items Addressed This Visit    None    Visit Diagnoses    Lumbar disc disease with radiculopathy    -  Primary   Onset recently c/w herniated disc. Will continue aleve and flexeril and prescribe prednisone 10mg  daily and tramadol prn sever pain for 5 day duration.   Relevant Medications   predniSONE (DELTASONE) 10 MG tablet   traMADol (ULTRAM) 50 MG tablet      Meds ordered this encounter  Medications  . predniSONE (DELTASONE) 10 MG tablet    Sig: Take 1 tablet (10 mg total) by mouth daily with breakfast.    Dispense:  30 tablet    Refill:  0  . traMADol (ULTRAM) 50 MG tablet    Sig: Take 1 tablet (50 mg total) by mouth every 6 (six) hours as needed.    Dispense:  20 tablet    Refill:  0      Dr. Ernestina Joe [facility]  10/24/17

## 2017-10-25 ENCOUNTER — Other Ambulatory Visit: Payer: Self-pay

## 2018-02-21 ENCOUNTER — Ambulatory Visit: Payer: Medicare HMO | Admitting: Family Medicine

## 2018-04-26 ENCOUNTER — Ambulatory Visit (INDEPENDENT_AMBULATORY_CARE_PROVIDER_SITE_OTHER): Payer: Medicare HMO | Admitting: Family Medicine

## 2018-04-26 ENCOUNTER — Encounter: Payer: Self-pay | Admitting: Family Medicine

## 2018-04-26 VITALS — BP 130/80 | HR 80 | Ht 72.0 in | Wt 213.0 lb

## 2018-04-26 DIAGNOSIS — B356 Tinea cruris: Secondary | ICD-10-CM | POA: Diagnosis not present

## 2018-04-26 DIAGNOSIS — I10 Essential (primary) hypertension: Secondary | ICD-10-CM | POA: Diagnosis not present

## 2018-04-26 MED ORDER — AMLODIPINE BESYLATE 10 MG PO TABS: 10.0000 mg | ORAL_TABLET | Freq: Every day | ORAL | 1 refills | Status: DC

## 2018-04-26 MED ORDER — CLOTRIMAZOLE-BETAMETHASONE 1-0.05 % EX CREA: 1.0000 "application " | TOPICAL_CREAM | Freq: Two times a day (BID) | CUTANEOUS | 0 refills | Status: DC

## 2018-04-26 NOTE — Progress Notes (Signed)
Date:  04/26/2018   Name:  Angel Cummings.   DOB:  05/13/1945   MRN:  [id number]   Chief Complaint: Rash (heat sensitive on top of L) thigh. Comes out in the heat/ wearing long johns- whelps, itches. goes away with a diaper rash ointment, but comes back) and Hypertension (needs labs)  Rash  This is a chronic problem. The current episode started more than 1 month ago. The problem has been waxing and waning since onset. The affected locations include the left upper leg. The rash is characterized by redness and itchiness. Associated with: heat and diaphoresis. Pertinent negatives include no anorexia, congestion, cough, diarrhea, eye pain, facial edema, fatigue, fever, joint pain, nail changes, rhinorrhea, shortness of breath, sore throat or vomiting. Treatments tried: antiyeast. The treatment provided mild relief.  Hypertension  This is a chronic problem. The current episode started more than 1 year ago. The problem is unchanged. The problem is controlled. Pertinent negatives include no anxiety, blurred vision, chest pain, headaches, malaise/fatigue, neck pain, orthopnea, palpitations, peripheral edema, PND, shortness of breath or sweats. There are no associated agents to hypertension. Past treatments include calcium channel blockers. The current treatment provides moderate improvement. There are no compliance problems.  There is no history of angina, kidney disease, CAD/MI, CVA, heart failure, left ventricular hypertrophy, PVD or retinopathy. There is no history of chronic renal disease, a hypertension causing med or renovascular disease.    Review of Systems  Constitutional: Negative for chills, fatigue, fever and malaise/fatigue.  HENT: Negative for congestion, drooling, ear discharge, ear pain, rhinorrhea and sore throat.   Eyes: Negative for blurred vision and pain.  Respiratory: Negative for cough, shortness of breath and wheezing.   Cardiovascular: Negative for chest pain, palpitations,  orthopnea, leg swelling and PND.  Gastrointestinal: Negative for abdominal pain, anorexia, blood in stool, constipation, diarrhea, nausea and vomiting.  Endocrine: Negative for polydipsia.  Genitourinary: Negative for dysuria, frequency, hematuria and urgency.  Musculoskeletal: Negative for back pain, joint pain, myalgias and neck pain.  Skin: Positive for rash. Negative for nail changes.  Allergic/Immunologic: Negative for environmental allergies.  Neurological: Negative for dizziness and headaches.  Hematological: Does not bruise/bleed easily.  Psychiatric/Behavioral: Negative for suicidal ideas. The patient is not nervous/anxious.     Patient Active Problem List   Diagnosis Date Noted  . Arthritis of both hands 04/17/2016  . Arthritis of hand 04/17/2016  . Essential hypertension 06/01/2015  . Hyperlipidemia 06/01/2015    Allergies  Allergen Reactions  . Etodolac     Past Surgical History:  Procedure Laterality Date  . COLONOSCOPY  2005   Dr Vickki Muff in [place] Left     Social History   Tobacco Use  . Smoking status: Never Smoker  . Smokeless tobacco: Never Used  Substance Use Topics  . Alcohol use: Yes    Alcohol/week: 0.0 standard drinks  . Drug use: No     Medication list has been reviewed and updated.  Current Meds  Medication Sig  . amLODipine (NORVASC) 10 MG tablet Take 1 tablet (10 mg total) by mouth daily.  Marland Kitchen aspirin 81 MG tablet Take 1 tablet (81 mg total) by mouth daily.  Marland Kitchen ibuprofen (ADVIL,MOTRIN) 200 MG tablet Take 1 tablet (200 mg total) by mouth every 6 (six) hours as needed.  . traMADol (ULTRAM) 50 MG tablet Take 1 tablet (50 mg total) by mouth every 6 (six) hours as needed.  PHQ 2/9 Scores 04/26/2018 10/24/2017 08/28/2016 06/21/2015  PHQ - 2 Score 2 0 0 0  PHQ- 9 Score 6 0 - -    Physical Exam Vitals signs and nursing note reviewed.  HENT:     Head: Normocephalic.     Right Ear: External ear normal.      Left Ear: External ear normal.     Nose: Nose normal.  Eyes:     General: No scleral icterus.       Right eye: No discharge.        Left eye: No discharge.     Conjunctiva/sclera: Conjunctivae normal.     Pupils: Pupils are equal, round, and reactive to light.  Neck:     Musculoskeletal: Normal range of motion and neck supple.     Thyroid: No thyromegaly.     Vascular: No JVD.     Trachea: No tracheal deviation.  Cardiovascular:     Rate and Rhythm: Normal rate and regular rhythm.     Heart sounds: Normal heart sounds. No murmur. No friction rub. No gallop.   Pulmonary:     Effort: No respiratory distress.     Breath sounds: Normal breath sounds. No wheezing or rales.  Abdominal:     General: Bowel sounds are normal.     Palpations: Abdomen is soft. There is no mass.     Tenderness: There is no abdominal tenderness. There is no guarding or rebound.  Musculoskeletal: Normal range of motion.        General: No tenderness.  Lymphadenopathy:     Cervical: No cervical adenopathy.  Skin:    General: Skin is warm.     Findings: Erythema and rash present.  Neurological:     Mental Status: He is alert and oriented to person, place, and time.     Cranial Nerves: No cranial nerve deficit.     Deep Tendon Reflexes: Reflexes are normal and symmetric.     BP 130/80   Pulse 80   Ht 6' (1.829 m)   Wt 213 lb (96.6 kg)   BMI 28.89 kg/m   Assessment and Plan: 1. Essential hypertension Chronic. Controlled- stable on meds- refill Amlodipine/ draw renal panel - amLODipine (NORVASC) 10 MG tablet; Take 1 tablet (10 mg total) by mouth daily.  Dispense: 90 tablet; Refill: 1 - Renal Function Panel  2. Tinea inguinalis Rash on left upper leg that comes out when wearing tight clothes or skin gets hot. Rash is red and itchy. Prescribe Lotrisone cream to apply to area twice a day. - clotrimazole-betamethasone (LOTRISONE) cream; Apply 1 application topically 2 (two) times daily.  Dispense: 30 g;  Refill: 0

## 2018-04-27 LAB — RENAL FUNCTION PANEL
Albumin: 4.4 g/dL (ref 3.5–4.8)
BUN / CREAT RATIO: 14 (ref 10–24)
BUN: 13 mg/dL (ref 8–27)
CHLORIDE: 104 mmol/L (ref 96–106)
CO2: 24 mmol/L (ref 20–29)
CREATININE: 0.9 mg/dL (ref 0.76–1.27)
Calcium: 9.3 mg/dL (ref 8.6–10.2)
GFR, EST AFRICAN AMERICAN: 100 mL/min/{1.73_m2} (ref >59)
GFR, EST NON AFRICAN AMERICAN: 86 mL/min/{1.73_m2} (ref >59)
Glucose: 110 mg/dL — ABNORMAL HIGH (ref 65–99)
Phosphorus: 3 mg/dL (ref 2.5–4.5)
Potassium: 4.2 mmol/L (ref 3.5–5.2)
Sodium: 142 mmol/L (ref 134–144)

## 2018-05-24 ENCOUNTER — Encounter: Payer: Self-pay | Admitting: Family Medicine

## 2018-05-24 ENCOUNTER — Ambulatory Visit (INDEPENDENT_AMBULATORY_CARE_PROVIDER_SITE_OTHER): Payer: Medicare HMO | Admitting: Family Medicine

## 2018-05-24 VITALS — BP 130/86 | HR 60 | Ht 72.0 in | Wt 216.0 lb

## 2018-05-24 DIAGNOSIS — M67921 Unspecified disorder of synovium and tendon, right upper arm: Secondary | ICD-10-CM | POA: Diagnosis not present

## 2018-05-24 DIAGNOSIS — G4762 Sleep related leg cramps: Secondary | ICD-10-CM | POA: Diagnosis not present

## 2018-05-24 DIAGNOSIS — S39012D Strain of muscle, fascia and tendon of lower back, subsequent encounter: Secondary | ICD-10-CM | POA: Diagnosis not present

## 2018-05-24 MED ORDER — PREDNISONE 10 MG PO TABS: ORAL_TABLET | ORAL | 1 refills | Status: DC

## 2018-05-24 NOTE — Progress Notes (Signed)
Date:  05/24/2018   Name:  Angel Cummings.   DOB:  1945/12/18   MRN:  [id number]   Chief Complaint: Shoulder Pain (limited ROM in L) arm- can't raise arm to reach for a light switch. Once arm is extended- can't put downward pressure like washing a car) and Back Pain (feels a "tingling" sensation in lower back when squats to sit down)  Shoulder Pain   The pain is present in the right arm. This is a new problem. The current episode started in the past 7 days (reaching to throw keys on car). History of extremity trauma: pain while range of motion  The problem occurs constantly. The problem has been unchanged. The quality of the pain is described as aching. The pain is at a severity of 5/10. The pain is moderate. Associated symptoms include a limited range of motion and tingling. Pertinent negatives include no fever. He has tried NSAIDS for the symptoms. The treatment provided mild relief. His past medical history is significant for osteoarthritis.  Back Pain  This is a recurrent problem. The problem occurs intermittently (comes and goes/ ). The problem has been waxing and waning since onset. The pain is present in the lumbar spine. The quality of the pain is described as aching. The pain does not radiate. The pain is moderate. The symptoms are aggravated by lying down. Associated symptoms include tingling. Pertinent negatives include no abdominal pain, chest pain, dysuria, fever or headaches.    Review of Systems  Constitutional: Negative for chills and fever.  HENT: Negative for drooling, ear discharge, ear pain and sore throat.   Respiratory: Negative for cough, shortness of breath and wheezing.   Cardiovascular: Negative for chest pain, palpitations and leg swelling.  Gastrointestinal: Negative for abdominal pain, blood in stool, constipation, diarrhea and nausea.  Endocrine: Negative for polydipsia.  Genitourinary: Negative for dysuria, frequency, hematuria and urgency.  Musculoskeletal:  Positive for back pain. Negative for myalgias and neck pain.  Skin: Negative for rash.  Allergic/Immunologic: Negative for environmental allergies.  Neurological: Positive for tingling. Negative for dizziness and headaches.  Hematological: Does not bruise/bleed easily.  Psychiatric/Behavioral: Negative for suicidal ideas. The patient is not nervous/anxious.     Patient Active Problem List   Diagnosis Date Noted  . Arthritis of both hands 04/17/2016  . Arthritis of hand 04/17/2016  . Essential hypertension 06/01/2015  . Hyperlipidemia 06/01/2015    Allergies  Allergen Reactions  . Etodolac     Past Surgical History:  Procedure Laterality Date  . COLONOSCOPY  2005   Dr Vickki Muff in [place] Left     Social History   Tobacco Use  . Smoking status: Never Smoker  . Smokeless tobacco: Never Used  Substance Use Topics  . Alcohol use: Yes    Alcohol/week: 0.0 standard drinks  . Drug use: No     Medication list has been reviewed and updated.  Current Meds  Medication Sig  . amLODipine (NORVASC) 10 MG tablet Take 1 tablet (10 mg total) by mouth daily.  Marland Kitchen aspirin 81 MG tablet Take 1 tablet (81 mg total) by mouth daily.  . clotrimazole-betamethasone (LOTRISONE) cream Apply 1 application topically 2 (two) times daily.  Marland Kitchen ibuprofen (ADVIL,MOTRIN) 200 MG tablet Take 1 tablet (200 mg total) by mouth every 6 (six) hours as needed.    PHQ 2/9 Scores 04/26/2018 10/24/2017 08/28/2016 06/21/2015  PHQ - 2 Score 2 0 0 0  PHQ- 9 Score 6 0 - -    Physical Exam Vitals signs and nursing note reviewed.  HENT:     Head: Normocephalic.     Right Ear: External ear normal.     Left Ear: External ear normal.     Nose: Nose normal.  Eyes:     General: No scleral icterus.       Right eye: No discharge.        Left eye: No discharge.     Conjunctiva/sclera: Conjunctivae normal.     Pupils: Pupils are equal, round, and reactive to light.  Neck:      Musculoskeletal: Normal range of motion and neck supple.     Thyroid: No thyromegaly.     Vascular: No JVD.     Trachea: No tracheal deviation.  Cardiovascular:     Rate and Rhythm: Normal rate and regular rhythm.     Heart sounds: Normal heart sounds. No murmur. No friction rub. No gallop.   Pulmonary:     Effort: No respiratory distress.     Breath sounds: Normal breath sounds. No wheezing or rales.  Abdominal:     General: Bowel sounds are normal.     Palpations: Abdomen is soft. There is no mass.     Tenderness: There is no abdominal tenderness. There is no guarding or rebound.  Musculoskeletal:        General: No tenderness.  Lymphadenopathy:     Cervical: No cervical adenopathy.  Skin:    General: Skin is warm.     Findings: No rash.  Neurological:     Mental Status: He is alert and oriented to person, place, and time.     Cranial Nerves: No cranial nerve deficit.     Deep Tendon Reflexes: Reflexes are normal and symmetric.     BP 130/86   Pulse 60   Ht 6' (1.829 m)   Wt 216 lb (98 kg)   BMI 29.29 kg/m   Assessment and Plan:  1. Tendinopathy of right biceps tendon Acute.  Persistent.  Patient probably has a partial tear of the origin of the biceps on the right forearm.  Will take ibuprofen 200 mg on a regular basis and I will start on some prednisone taper 60 mg and rechecking in 2 weeks.  2. Lumbar strain, subsequent encounter Patient has lower back pain related to the for extremity discomfort he had when he had a cervical radiculopathy again we will start with ibuprofen 200 mg every 6 8 hours and a prednisone taper.  3. Nocturnal leg cramps Discussed with patient the onset of nocturnal leg cramps and trip channels.  We will start by trying some yellow mustard which he has noted that a friend of his does as well.  Check in 2 weeks and then determine if further evaluation in the meantime I did recheck his previous renal panel and noted that all the electrolytes were  in normal range.

## 2018-05-24 NOTE — Patient Instructions (Addendum)
Leg Cramps Leg cramps occur when one or more muscles tighten and you have no control over this tightening (involuntary muscle contraction). Muscle cramps can develop in any muscle, but the most common place is in the calf muscles of the leg. Those cramps can occur during exercise or when you are at rest. Leg cramps are painful, and they may last for a few seconds to a few minutes. Cramps may return several times before they finally stop. Usually, leg cramps are not caused by a serious medical problem. In many cases, the cause is not known. Some common causes include:  Excessive physical effort (overexertion), such as during intense exercise.  Overuse from repetitive motions, or doing the same thing over and over.  Staying in a certain position for a long period of time.  Improper preparation, form, or technique while performing a sport or an activity.  Dehydration.  Injury.  Side effects of certain medicines.  Abnormally low levels of minerals in your blood (electrolytes), especially potassium and calcium. This could result from: ? Pregnancy. ? Taking diuretic medicines. Follow these instructions at home: Eating and drinking  Drink enough fluid to keep your urine pale yellow. Staying hydrated may help prevent cramps.  Eat a healthy diet that includes plenty of nutrients to help your muscles function. A healthy diet includes fruits and vegetables, lean protein, whole grains, and low-fat or nonfat dairy products. Managing pain, stiffness, and swelling      Try massaging, stretching, and relaxing the affected muscle. Do this for several minutes at a time.  If directed, put ice on areas that are sore or painful after a cramp: ? Put ice in a plastic bag. ? Place a towel between your skin and the bag. ? Leave the ice on for 20 minutes, 2-3 times a day.  If directed, apply heat to muscles that are tense or tight. Do this before you exercise, or as often as told by your health care  provider. Use the heat source that your health care provider recommends, such as a moist heat pack or a heating pad. ? Place a towel between your skin and the heat source. ? Leave the heat on for 20-30 minutes. ? Remove the heat if your skin turns bright red. This is especially important if you are unable to feel pain, heat, or cold. You may have a greater risk of getting burned.  Try taking hot showers or baths to help relax tight muscles. General instructions  If you are having frequent leg cramps, avoid intense exercise for several days.  Take over-the-counter and prescription medicines only as told by your health care provider.  Keep all follow-up visits as told by your health care provider. This is important. Contact a health care provider if:  Your leg cramps get more severe or more frequent, or they do not improve over time.  Your foot becomes cold, numb, or blue. Summary  Muscle cramps can develop in any muscle, but the most common place is in the calf muscles of the leg.  Leg cramps are painful, and they may last for a few seconds to a few minutes.  Usually, leg cramps are not caused by a serious medical problem. Often, the cause is not known.  Stay hydrated and take over-the-counter and prescription medicines only as told by your health care provider. This information is not intended to replace advice given to you by your health care provider. Make sure you discuss any questions you have with your health care  provider. Document Released: 06/01/2004 Document Revised: 02/01/2017 Document Reviewed: 02/01/2017 Elsevier Interactive Patient Education  2019 [employer]  Muscle Cramps and Spasms Muscle cramps and spasms occur when a muscle or muscles tighten and you have no control over this tightening (involuntary muscle contraction). They are a common problem and can develop in any muscle. The most common place is in the calf muscles of the leg. Muscle cramps and muscle spasms  are both involuntary muscle contractions, but there are some differences between the two:  Muscle cramps are painful. They come and go and may last for a few seconds or up to 15 minutes. Muscle cramps are often more forceful and last longer than muscle spasms.  Muscle spasms may or may not be painful. They may also last just a few seconds or much longer. Certain medical conditions, such as diabetes or Parkinson's disease, can make it more likely to develop cramps or spasms. However, cramps or spasms are usually not caused by a serious underlying problem. Common causes include:  Doing more physical work or exercise than your body is ready for (overexertion).  Overuse from repeating certain movements too many times.  Remaining in a certain position for a long period of time.  Improper preparation, form, or technique while playing a sport or doing an activity.  Dehydration.  Injury.  Side effects of some medicines.  Abnormally low levels of the salts and minerals in your blood (electrolytes), especially potassium and calcium. This could happen if you are taking water pills (diuretics) or if you are pregnant. In many cases, the cause of muscle cramps or spasms is not known. Follow these instructions at home: Managing pain and stiffness      Try massaging, stretching, and relaxing the affected muscle. Do this for several minutes at a time.  If directed, apply heat to tight or tense muscles as often as told by your health care provider. Use the heat source that your health care provider recommends, such as a moist heat pack or a heating pad. ? Place a towel between your skin and the heat source. ? Leave the heat on for 20-30 minutes. ? Remove the heat if your skin turns bright red. This is especially important if you are unable to feel pain, heat, or cold. You may have a greater risk of getting burned.  If directed, put ice on the affected area. This may help if you are sore or have  pain after a cramp or spasm. ? Put ice in a plastic bag. ? Place a towel between your skin and the bag. ? Leavethe ice on for 20 minutes, 2-3 times a day.  Try taking hot showers or baths to help relax tight muscles. Eating and drinking  Drink enough fluid to keep your urine pale yellow. Staying well hydrated may help prevent cramps or spasms.  Eat a healthy diet that includes plenty of nutrients to help your muscles function. A healthy diet includes fruits and vegetables, lean protein, whole grains, and low-fat or nonfat dairy products. General instructions  If you are having frequent cramps, avoid intense exercise for several days.  Take over-the-counter and prescription medicines only as told by your health care provider.  Pay attention to any changes in your symptoms.  Keep all follow-up visits as told by your health care provider. This is important. Contact a health care provider if:  Your cramps or spasms get more severe or happen more often.  Your cramps or spasms do not improve over time.  Summary  Muscle cramps and spasms occur when a muscle or muscles tighten and you have no control over this tightening (involuntary muscle contraction).  The most common place for cramps or spasms to occur is in the calf muscles of the leg.  Massaging, stretching, and relaxing the affected muscle may relieve the cramp or spasm.  Drink enough fluid to keep your urine pale yellow. Staying well hydrated may help prevent cramps or spasms. This information is not intended to replace advice given to you by your health care provider. Make sure you discuss any questions you have with your health care provider. Document Released: 10/14/2001 Document Revised: 09/17/2017 Document Reviewed: 09/17/2017 Elsevier Interactive Patient Education  2019 [employer].

## 2018-06-07 ENCOUNTER — Ambulatory Visit (INDEPENDENT_AMBULATORY_CARE_PROVIDER_SITE_OTHER): Payer: Medicare HMO | Admitting: Family Medicine

## 2018-06-07 ENCOUNTER — Encounter: Payer: Self-pay | Admitting: Family Medicine

## 2018-06-07 VITALS — BP 124/70 | HR 72 | Ht 72.0 in | Wt 209.0 lb

## 2018-06-07 DIAGNOSIS — M7521 Bicipital tendinitis, right shoulder: Secondary | ICD-10-CM

## 2018-06-07 NOTE — Patient Instructions (Signed)
Chondroitin; Glucosamine tablets or capsules What is this medicine? CHONDROITIN; GLUCOSAMINE (chon DROI tin; gloo KOH suh meen) is a dietary supplement. It is promoted for its ability to reduce the symptoms of osteoarthritis by maintaining healthy joint cartilage. The FDA has not approved this supplement for any medical use. This medicine may be used for other purposes; ask your health care provider or pharmacist if you have questions. COMMON BRAND NAME(S): OptiFlex Complete, OptiFlex Sport What should I tell my health care provider before I take this medicine? They need to know if you have any of these conditions: -diabetes -heart disease -kidney disease -liver disease -stomach or intestinal problems -an unusual or allergic reaction to chondroitin, glucosamine, sulfonamides, other medicines, foods, dyes, or preservatives -pregnant or trying to get pregnant -breast-feeding How should I use this medicine? Take this supplement by mouth with a glass of water. Follow the directions on the package labeling, or take as directed by your health care professional. Take your medicine at regular intervals. Do not take this supplement more often than directed. Talk to your pediatrician regarding the use of this medicine in children. Special care may be needed. Overdosage: If you think you have taken too much of this medicine contact a poison control center or emergency room at once. NOTE: This medicine is only for you. Do not share this medicine with others. What if I miss a dose? If you miss a dose, take it as soon as you can. If it is almost time for your next dose, take only that dose. Do not take double or extra doses. What may interact with this medicine? Check with your doctor or healthcare professional if you are taking any of the following medications: -warfarin This list may not describe all possible interactions. Give your health care provider a list of all the medicines, herbs,  non-prescription drugs, or dietary supplements you use. Also tell them if you smoke, drink alcohol, or use illegal drugs. Some items may interact with your medicine. What should I watch for while using this medicine? Tell your doctor or healthcare professional if your symptoms do not start to get better or if they get worse. This supplement may take several weeks to work for you. If you are scheduled for any medical or dental procedure, tell your healthcare provider that you are taking this supplement. You may need to stop taking this supplement before the procedure. This supplement may affect blood sugar levels. If you have diabetes, check with your doctor or health care professional before you change your diet or the dose of your diabetic medicine. Herbal or dietary supplements are not regulated like medicines. Rigid quality control standards are not required for dietary supplements. The purity and strength of these products can vary. The safety and effect of this dietary supplement for a certain disease or illness is not well known. This product is not intended to diagnose, treat, cure or prevent any disease. The Food and Drug Administration suggests the following to help consumers protect themselves: -Always read product labels and follow directions. -Natural does not mean a product is safe for humans to take. -Look for products that include USP after the ingredient name. This means that the manufacturer followed the standards of the [place] Pharmacopoeia. -Supplements made or sold by a nationally known food or drug company are more likely to be made under tight controls. You can write to the company for more information about how the product was made. What side effects may I notice from receiving this  medicine? Side effects that you should report to your doctor or health care professional as soon as possible: -allergic reactions like skin rash, itching or hives, swelling of the face, lips, or  tongue -breathing problems -constipation -diarrhea -difficulty sleeping -drowsiness -hair loss -headache -loss of appetite -stomach pain -swelling of the ankles or feet Side effects that usually do not require medical attention (report to your doctor or health care professional if they continue or are bothersome): -gas -nausea -upset stomach This list may not describe all possible side effects. Call your doctor for medical advice about side effects. You may report side effects to FDA at 1-800-FDA-1088. Where should I keep my medicine? Keep out of the reach of children. Store at room temperature between 15 and 30 degrees C (59 and 86 degrees F) or as directed on the package label. Protect from moisture. Throw away any unused supplement after the expiration date. NOTE: This sheet is a summary. It may not cover all possible information. If you have questions about this medicine, talk to your doctor, pharmacist, or health care provider.  2019 Elsevier/Gold Standard (2014-05-18 06:58:19)

## 2018-06-07 NOTE — Progress Notes (Signed)
Date:  06/07/2018   Name:  Angel Cummings.   DOB:  1946/04/24   MRN:  [id number]   Chief Complaint: Follow-up (R) arm pain and leg cramps- arm is better/ takes last pred today. Cramps are about the same)  Shoulder Pain   The pain is present in the right shoulder. This is a chronic problem. The current episode started 1 to 4 weeks ago (3-4 weeks). There has been no history of extremity trauma. The problem occurs intermittently. The problem has been gradually improving. The quality of the pain is described as aching. The pain is moderate. Pertinent negatives include no fever, inability to bear weight, itching, joint locking, joint swelling, limited range of motion, numbness, stiffness or tingling. He has tried NSAIDS and acetaminophen (steroid taper) for the symptoms. The treatment provided moderate relief.    Review of Systems  Constitutional: Negative for chills and fever.  HENT: Negative for drooling, ear discharge, ear pain and sore throat.   Respiratory: Negative for cough, shortness of breath and wheezing.   Cardiovascular: Negative for chest pain, palpitations and leg swelling.  Gastrointestinal: Negative for abdominal pain, blood in stool, constipation, diarrhea and nausea.  Endocrine: Negative for polydipsia.  Genitourinary: Negative for dysuria, frequency, hematuria and urgency.  Musculoskeletal: Negative for back pain, myalgias, neck pain and stiffness.  Skin: Negative for itching and rash.  Allergic/Immunologic: Negative for environmental allergies.  Neurological: Negative for dizziness, tingling, numbness and headaches.  Hematological: Does not bruise/bleed easily.  Psychiatric/Behavioral: Negative for suicidal ideas. The patient is not nervous/anxious.     Patient Active Problem List   Diagnosis Date Noted  . Arthritis of both hands 04/17/2016  . Arthritis of hand 04/17/2016  . Essential hypertension 06/01/2015  . Hyperlipidemia 06/01/2015    Allergies  Allergen  Reactions  . Etodolac     Past Surgical History:  Procedure Laterality Date  . COLONOSCOPY  2005   Dr Vickki Muff in [place] Left     Social History   Tobacco Use  . Smoking status: Never Smoker  . Smokeless tobacco: Never Used  Substance Use Topics  . Alcohol use: Yes    Alcohol/week: 0.0 standard drinks  . Drug use: No     Medication list has been reviewed and updated.  Current Meds  Medication Sig  . amLODipine (NORVASC) 10 MG tablet Take 1 tablet (10 mg total) by mouth daily.  Marland Kitchen aspirin 81 MG tablet Take 1 tablet (81 mg total) by mouth daily.  . clotrimazole-betamethasone (LOTRISONE) cream Apply 1 application topically 2 (two) times daily.  Marland Kitchen ibuprofen (ADVIL,MOTRIN) 200 MG tablet Take 1 tablet (200 mg total) by mouth every 6 (six) hours as needed.  . predniSONE (DELTASONE) 10 MG tablet Taper 6,6,6,5,5,5,4,4,3,3,2,2,1,1    PHQ 2/9 Scores 04/26/2018 10/24/2017 08/28/2016 06/21/2015  PHQ - 2 Score 2 0 0 0  PHQ- 9 Score 6 0 - -    Physical Exam Vitals signs and nursing note reviewed.  HENT:     Head: Normocephalic.     Right Ear: External ear normal.     Left Ear: External ear normal.     Nose: Nose normal.  Eyes:     General: No scleral icterus.       Right eye: No discharge.        Left eye: No discharge.     Conjunctiva/sclera: Conjunctivae normal.     Pupils: Pupils are equal, round, and  reactive to light.  Neck:     Musculoskeletal: Normal range of motion and neck supple.     Thyroid: No thyromegaly.     Vascular: No JVD.     Trachea: No tracheal deviation.  Cardiovascular:     Rate and Rhythm: Normal rate and regular rhythm.     Heart sounds: Normal heart sounds. No murmur. No friction rub. No gallop.   Pulmonary:     Effort: No respiratory distress.     Breath sounds: Normal breath sounds. No wheezing or rales.  Abdominal:     General: Bowel sounds are normal.     Palpations: Abdomen is soft. There is no mass.      Tenderness: There is no abdominal tenderness. There is no guarding or rebound.  Musculoskeletal: Normal range of motion.     Right shoulder: He exhibits tenderness.     Comments: Tender right bicep tendon  Lymphadenopathy:     Cervical: No cervical adenopathy.  Skin:    General: Skin is warm.     Findings: No rash.  Neurological:     Mental Status: He is alert and oriented to person, place, and time.     Cranial Nerves: No cranial nerve deficit.     Deep Tendon Reflexes: Reflexes are normal and symmetric.     BP 124/70   Pulse 72   Ht 6' (1.829 m)   Wt 209 lb (94.8 kg)   BMI 28.35 kg/m   Assessment and Plan: 1. Biceps tendinitis of right upper extremity Check of biceps tendinitis.  And is completed course of steroid taper.  Has noticed significant improvement of his range of motion and pain.  Use nonsteroidal anti-inflammatories on an as-needed basis and consider the use of chondroitin on a daily basis.  If pain should return patient will call in our next step will be referral to orthopedics. 2. Nocturnal cramps-patient has been taking a tablespoon of mustard her to tiring to bed patient has noted an improvement in the intensity and frequency of his cramps although they have not been completely eliminated.  Patient will continue with this regimen as it is somewhat helpful and tolerated well.

## 2018-07-15 ENCOUNTER — Other Ambulatory Visit: Payer: Self-pay

## 2018-07-15 DIAGNOSIS — B356 Tinea cruris: Secondary | ICD-10-CM

## 2018-07-15 DIAGNOSIS — M7521 Bicipital tendinitis, right shoulder: Secondary | ICD-10-CM

## 2018-07-15 MED ORDER — CLOTRIMAZOLE-BETAMETHASONE 1-0.05 % EX CREA: 1.0000 "application " | TOPICAL_CREAM | Freq: Two times a day (BID) | CUTANEOUS | 0 refills | Status: DC

## 2018-07-15 NOTE — Progress Notes (Unsigned)
Refilled cream for rash and put in referral to ortho for shoulder pain

## 2018-07-18 DIAGNOSIS — S46211A Strain of muscle, fascia and tendon of other parts of biceps, right arm, initial encounter: Secondary | ICD-10-CM | POA: Diagnosis not present

## 2018-07-18 DIAGNOSIS — M25311 Other instability, right shoulder: Secondary | ICD-10-CM | POA: Diagnosis not present

## 2018-07-18 DIAGNOSIS — E669 Obesity, unspecified: Secondary | ICD-10-CM | POA: Diagnosis not present

## 2018-07-18 DIAGNOSIS — M25511 Pain in right shoulder: Secondary | ICD-10-CM | POA: Diagnosis not present

## 2018-07-18 DIAGNOSIS — M19011 Primary osteoarthritis, right shoulder: Secondary | ICD-10-CM | POA: Diagnosis not present

## 2018-07-18 DIAGNOSIS — G8929 Other chronic pain: Secondary | ICD-10-CM | POA: Diagnosis not present

## 2018-10-25 ENCOUNTER — Encounter: Payer: Self-pay | Admitting: Family Medicine

## 2018-10-25 ENCOUNTER — Ambulatory Visit (INDEPENDENT_AMBULATORY_CARE_PROVIDER_SITE_OTHER): Payer: Medicare HMO | Admitting: Family Medicine

## 2018-10-25 ENCOUNTER — Other Ambulatory Visit: Payer: Self-pay

## 2018-10-25 VITALS — BP 138/80 | HR 64 | Ht 72.0 in | Wt 208.0 lb

## 2018-10-25 DIAGNOSIS — L282 Other prurigo: Secondary | ICD-10-CM

## 2018-10-25 DIAGNOSIS — L309 Dermatitis, unspecified: Secondary | ICD-10-CM | POA: Diagnosis not present

## 2018-10-25 MED ORDER — PREDNISONE 10 MG PO TABS: ORAL_TABLET | ORAL | 1 refills | Status: DC

## 2018-10-25 MED ORDER — DESOXIMETASONE 0.25 % EX LIQD: 1.0000 "application " | Freq: Two times a day (BID) | CUTANEOUS | 1 refills | Status: DC

## 2018-10-25 NOTE — Progress Notes (Signed)
Date:  10/25/2018   Name:  Angel Cummings.   DOB:  1946/03/30   MRN:  [id number]   Chief Complaint: Rash (bumps on chest- "looks like spider bite"- itches. currently taking clindamycin for a root canal)  Rash This is a new problem. The current episode started 1 to 4 weeks ago. The problem has been waxing and waning since onset. The affected locations include the chest and torso. Pertinent negatives include no anorexia, congestion, cough, diarrhea, eye pain, facial edema, fatigue, fever, joint pain, nail changes, rhinorrhea, shortness of breath, sore throat or vomiting. The treatment provided no relief.    Review of Systems  Constitutional: Negative for chills, fatigue and fever.  HENT: Negative for congestion, drooling, ear discharge, ear pain, rhinorrhea and sore throat.   Eyes: Negative for pain.  Respiratory: Negative for cough, shortness of breath and wheezing.   Cardiovascular: Negative for chest pain, palpitations and leg swelling.  Gastrointestinal: Negative for abdominal pain, anorexia, blood in stool, constipation, diarrhea, nausea and vomiting.  Endocrine: Negative for polydipsia.  Genitourinary: Negative for dysuria, frequency, hematuria and urgency.  Musculoskeletal: Negative for back pain, joint pain, myalgias and neck pain.  Skin: Positive for rash. Negative for nail changes.  Allergic/Immunologic: Negative for environmental allergies.  Neurological: Negative for dizziness and headaches.  Hematological: Does not bruise/bleed easily.  Psychiatric/Behavioral: Negative for suicidal ideas. The patient is not nervous/anxious.     Patient Active Problem List   Diagnosis Date Noted  . Arthritis of both hands 04/17/2016  . Arthritis of hand 04/17/2016  . Essential hypertension 06/01/2015  . Hyperlipidemia 06/01/2015    Allergies  Allergen Reactions  . Etodolac     Past Surgical History:  Procedure Laterality Date  . COLONOSCOPY  2005   Dr Vickki Muff in [place] Left     Social History   Tobacco Use  . Smoking status: Never Smoker  . Smokeless tobacco: Never Used  Substance Use Topics  . Alcohol use: Yes    Alcohol/week: 0.0 standard drinks  . Drug use: No     Medication list has been reviewed and updated.  Current Meds  Medication Sig  . amLODipine (NORVASC) 10 MG tablet Take 1 tablet (10 mg total) by mouth daily.  Marland Kitchen aspirin 81 MG tablet Take 1 tablet (81 mg total) by mouth daily.  . clotrimazole-betamethasone (LOTRISONE) cream Apply 1 application topically 2 (two) times daily.  Marland Kitchen ibuprofen (ADVIL,MOTRIN) 200 MG tablet Take 1 tablet (200 mg total) by mouth every 6 (six) hours as needed.    PHQ 2/9 Scores 04/26/2018 10/24/2017 08/28/2016 06/21/2015  PHQ - 2 Score 2 0 0 0  PHQ- 9 Score 6 0 - -    BP Readings from Last 3 Encounters:  10/25/18 138/80  06/07/18 124/70  05/24/18 130/86    Physical Exam Vitals signs and nursing note reviewed.  HENT:     Head: Normocephalic.     Right Ear: External ear normal.     Left Ear: External ear normal.     Nose: Nose normal.  Eyes:     General: No scleral icterus.       Right eye: No discharge.        Left eye: No discharge.     Conjunctiva/sclera: Conjunctivae normal.     Pupils: Pupils are equal, round, and reactive to light.  Neck:     Musculoskeletal: Normal range of motion and neck supple.  Thyroid: No thyromegaly.     Vascular: No JVD.     Trachea: No tracheal deviation.  Cardiovascular:     Rate and Rhythm: Normal rate and regular rhythm.     Heart sounds: Normal heart sounds. No murmur. No friction rub. No gallop.   Pulmonary:     Effort: No respiratory distress.     Breath sounds: Normal breath sounds. No wheezing or rales.  Abdominal:     General: Bowel sounds are normal.     Palpations: Abdomen is soft. There is no mass.     Tenderness: There is no abdominal tenderness. There is no guarding or rebound.  Musculoskeletal: Normal range  of motion.        General: No tenderness.  Lymphadenopathy:     Cervical: No cervical adenopathy.  Skin:    General: Skin is warm.     Findings: Erythema and rash present. Rash is papular.     Comments: [place]  Neurological:     Mental Status: He is alert and oriented to person, place, and time.     Cranial Nerves: No cranial nerve deficit.     Deep Tendon Reflexes: Reflexes are normal and symmetric.     Wt Readings from Last 3 Encounters:  10/25/18 208 lb (94.3 kg)  06/07/18 209 lb (94.8 kg)  05/24/18 216 lb (98 kg)    BP 138/80   Pulse 64   Ht 6' (1.829 m)   Wt 208 lb (94.3 kg)   BMI 28.21 kg/m   Assessment and Plan:  1. Dermatitis New onset.  Patient had rash that developed prior been put on biotic for tooth.  Told patient it is unlikely if he developed a rash before the medication that the rash was caused by the medication even though he feels that this exacerbated it.  Patient is unable to come off the medication at this time so we will approach it for other reasons.  See below - predniSONE (DELTASONE) 10 MG tablet; Taper 6,6,6,5,5,5,4,4,3,3,2,2,1,1  Dispense: 53 tablet; Refill: 1 - Desoximetasone (TOPICORT) 0.25 % LIQD; Apply 1 application topically 2 (two) times daily.  Dispense: 100 mL; Refill: 1 - Ambulatory referral to Dermatology  2. Papular urticaria Patient has classic papular urticaria that is consistent with an insect bite.  Although patient does not feel that the insects are involved.  Will put him on a prednisone taper beginning at 60 mg and patient has not had resolution with Lotrisone cream so we will of the potency of topical steroid to Topicort 0.25% liquid spray applied twice a day.  We will put a referral in for dermatology. - predniSONE (DELTASONE) 10 MG tablet; Taper 6,6,6,5,5,5,4,4,3,3,2,2,1,1  Dispense: 53 tablet; Refill: 1 - Desoximetasone (TOPICORT) 0.25 % LIQD; Apply 1 application topically 2 (two) times daily.  Dispense: 100 mL; Refill: 1 -  Ambulatory referral to Dermatology

## 2018-10-29 DIAGNOSIS — K0381 Cracked tooth: Secondary | ICD-10-CM | POA: Diagnosis not present

## 2018-11-06 IMAGING — CR DG HAND COMPLETE 3+V*L*
3 series · 3 of 3 positions shown · non-contrast
Comparison: None.

CLINICAL DATA: Bilateral hand pain.  Clot deformity.

EXAM:
LEFT HAND - COMPLETE 3+ VIEW; RIGHT HAND - COMPLETE 3+ VIEW

[hand ap]
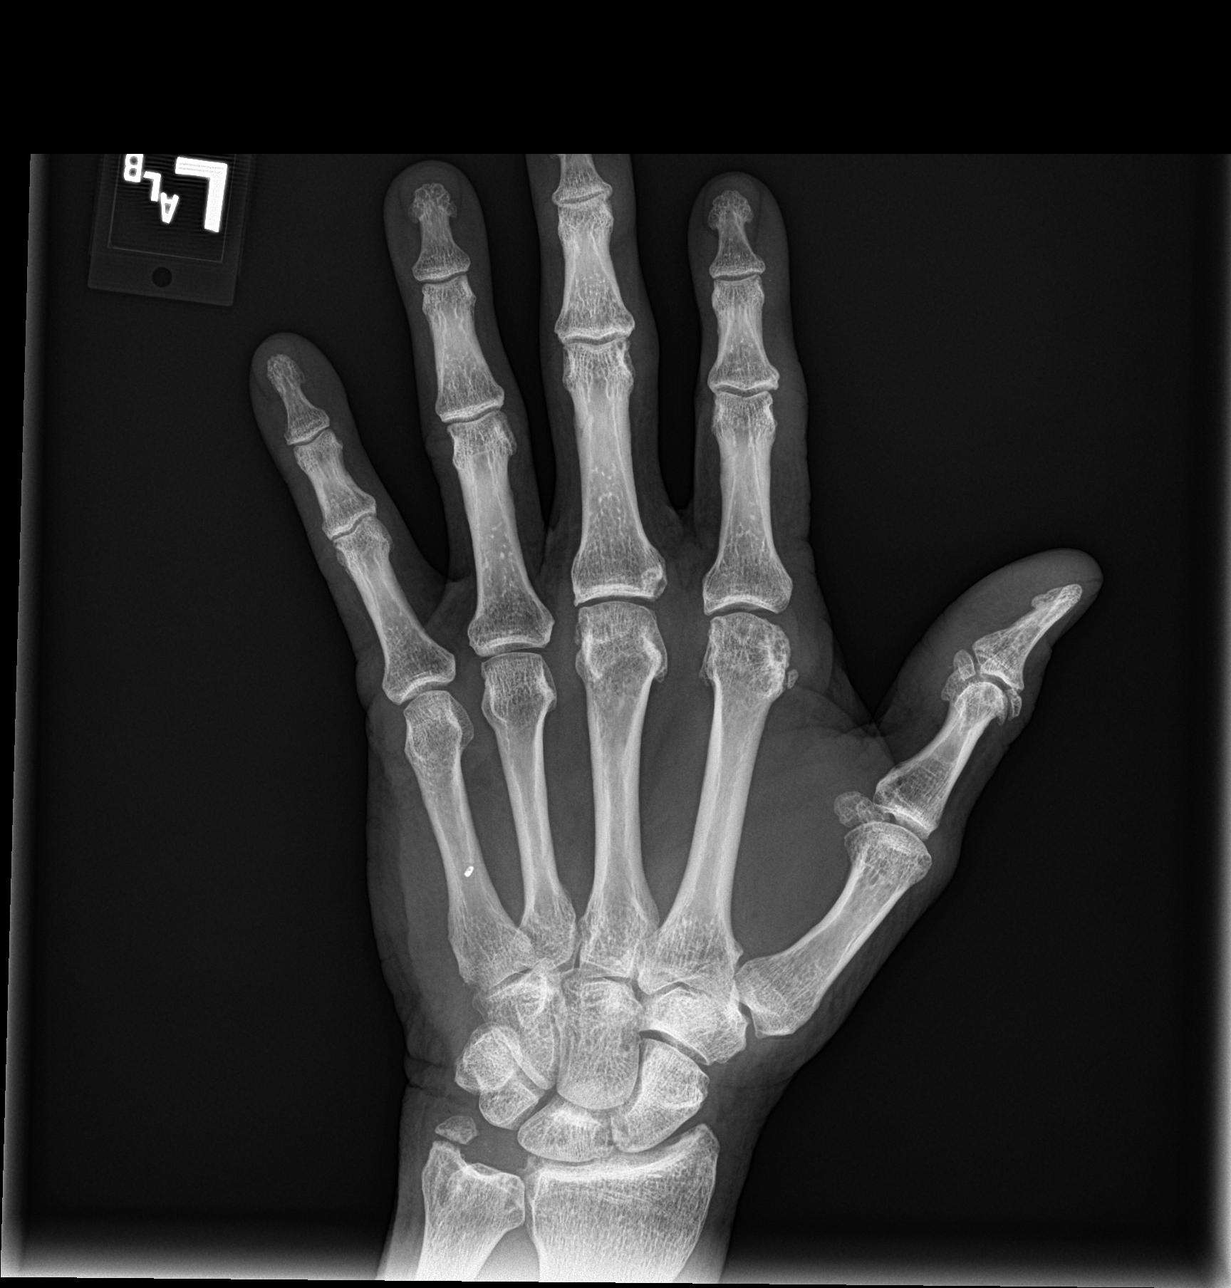

[hand obl]
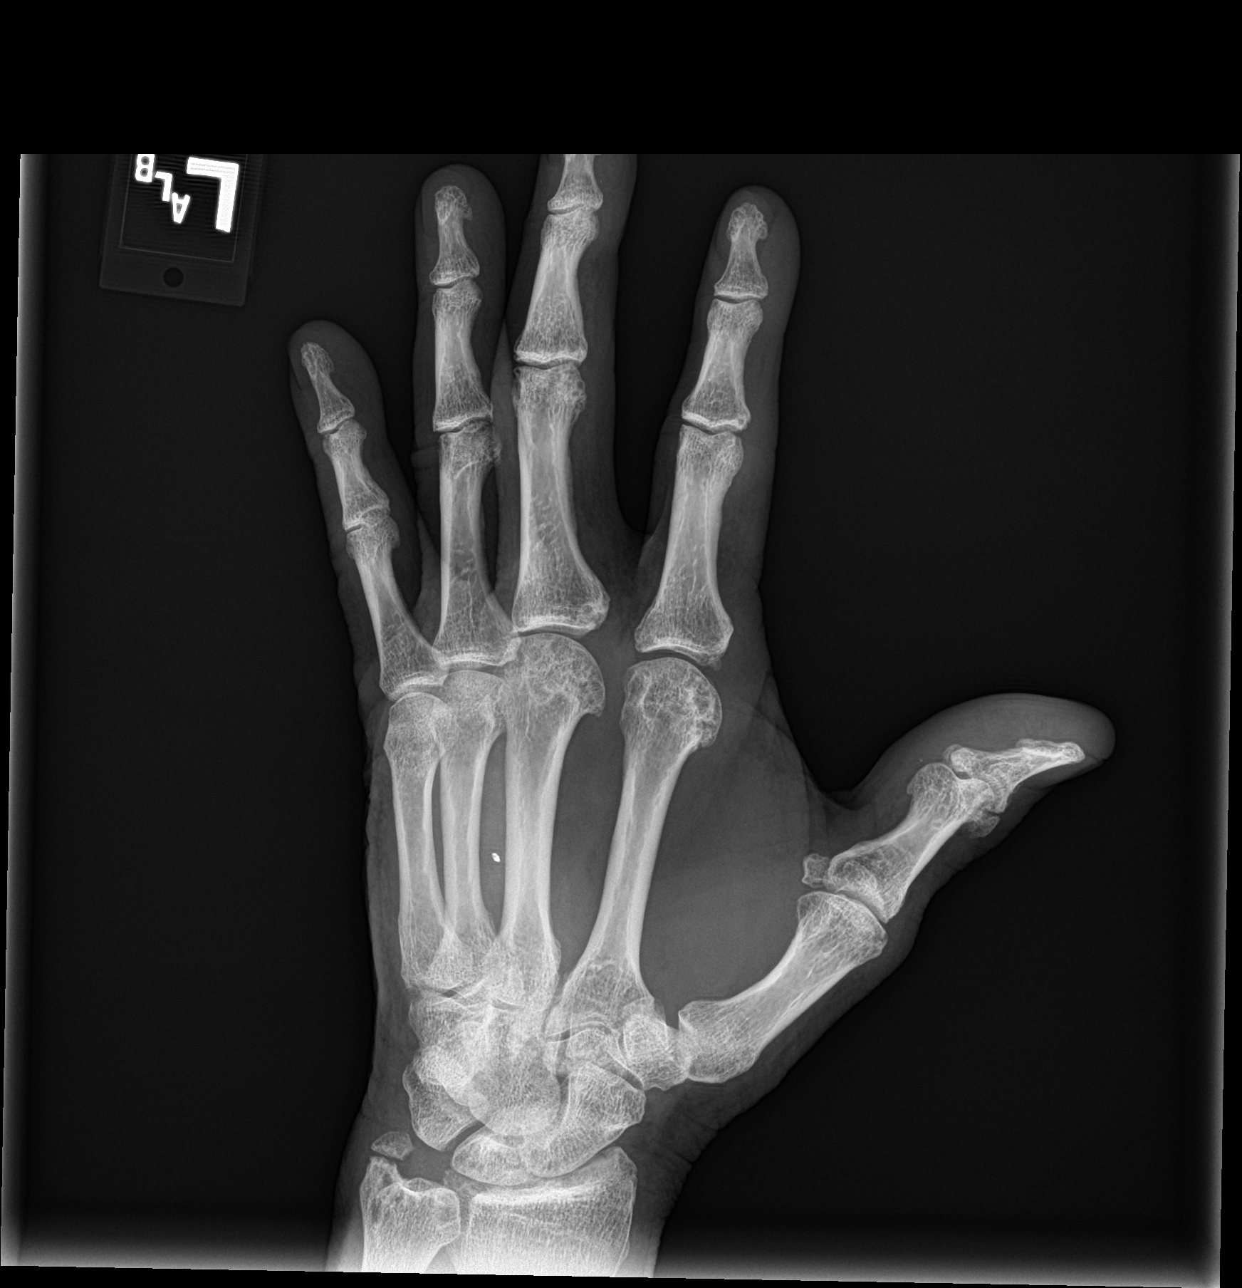

[hand lat]
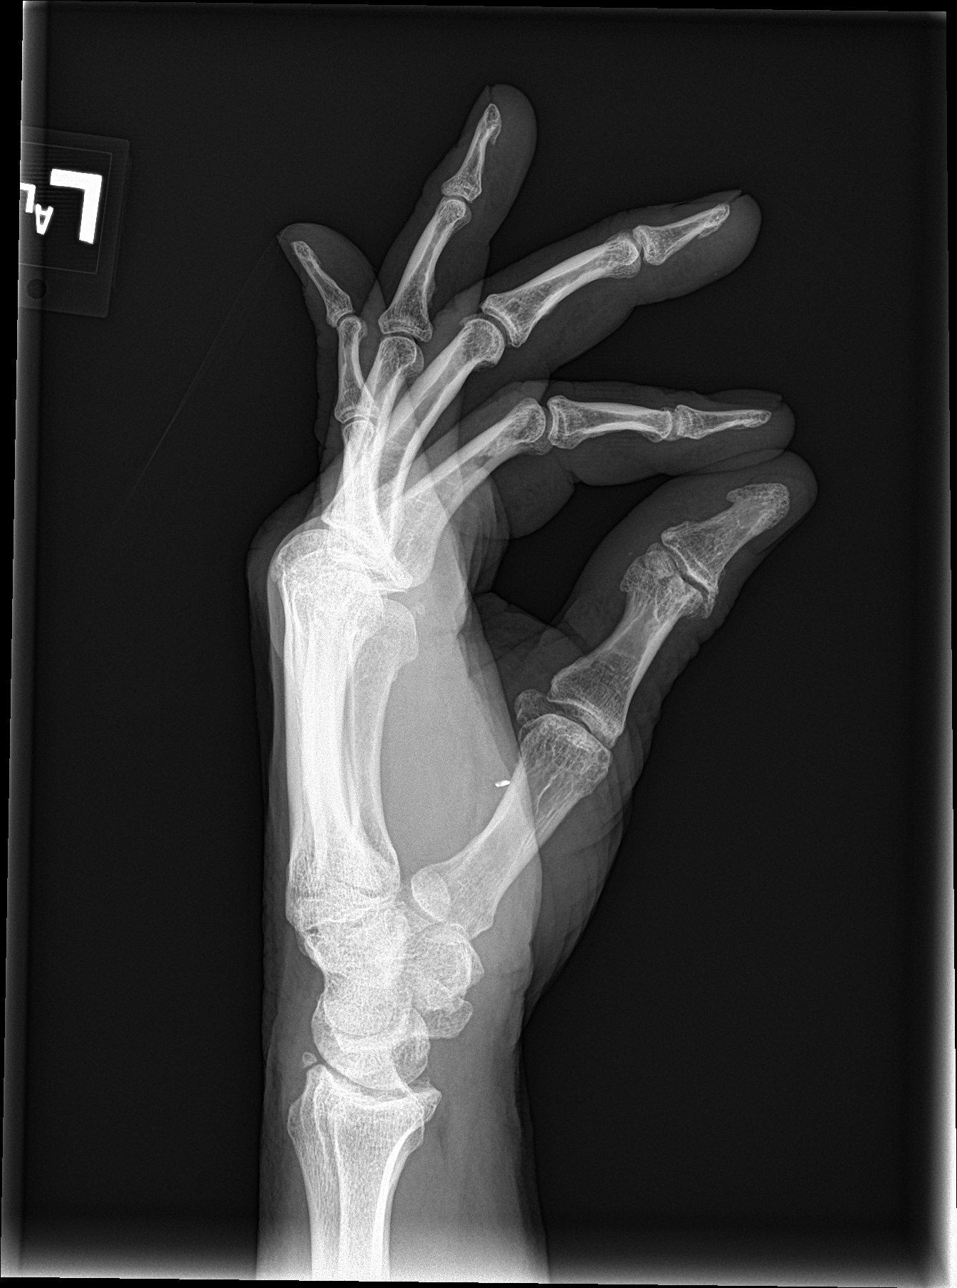

[3 of 3 positions shown; findings below may reference images not displayed]

FINDINGS: Bones: No fracture or dislocation. Generalized osteopenia. Old
ununited fracture of the left ulnar styloid process.

Joints: Normal alignment. No erosive changes. Mild osteoarthritis of
the first, second and third MCP joint bilaterally. Mild
osteoarthritis of the first CMC joint bilaterally. Mild
osteoarthritis of the distal radioulnar joint bilaterally. Moderate
osteoarthritis of the left first IP joint. Moderate osteoarthritis
of the second right DIP joint. Mild osteoarthritis of the third
right DIP joint.

Soft tissue: No soft tissue abnormality. No radiopaque foreign body.
No subcutaneous emphysema.
IMPRESSION: Osteoarthritis of bilateral hands as described above.

## 2018-11-06 IMAGING — CR DG HAND COMPLETE 3+V*R*
3 series · 3 of 3 positions shown · non-contrast
Comparison: None.

CLINICAL DATA: Bilateral hand pain.  Clot deformity.

EXAM:
LEFT HAND - COMPLETE 3+ VIEW; RIGHT HAND - COMPLETE 3+ VIEW

[hand ap]
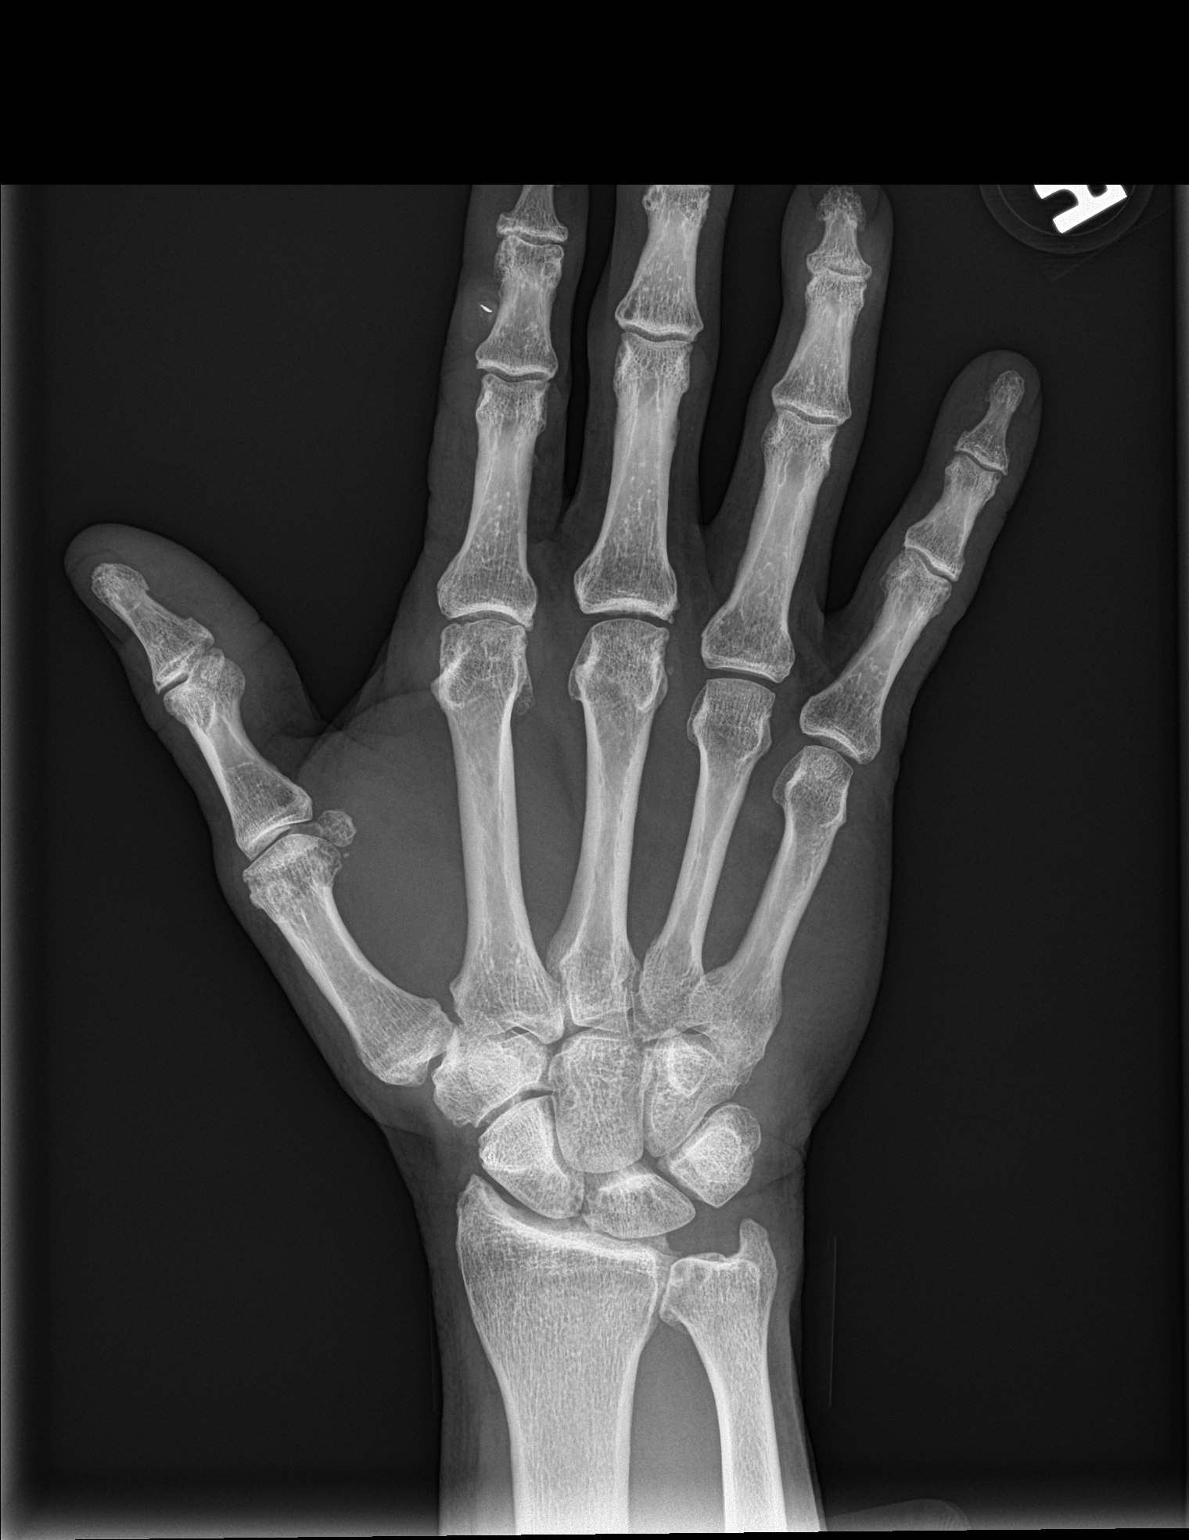

[hand obl]
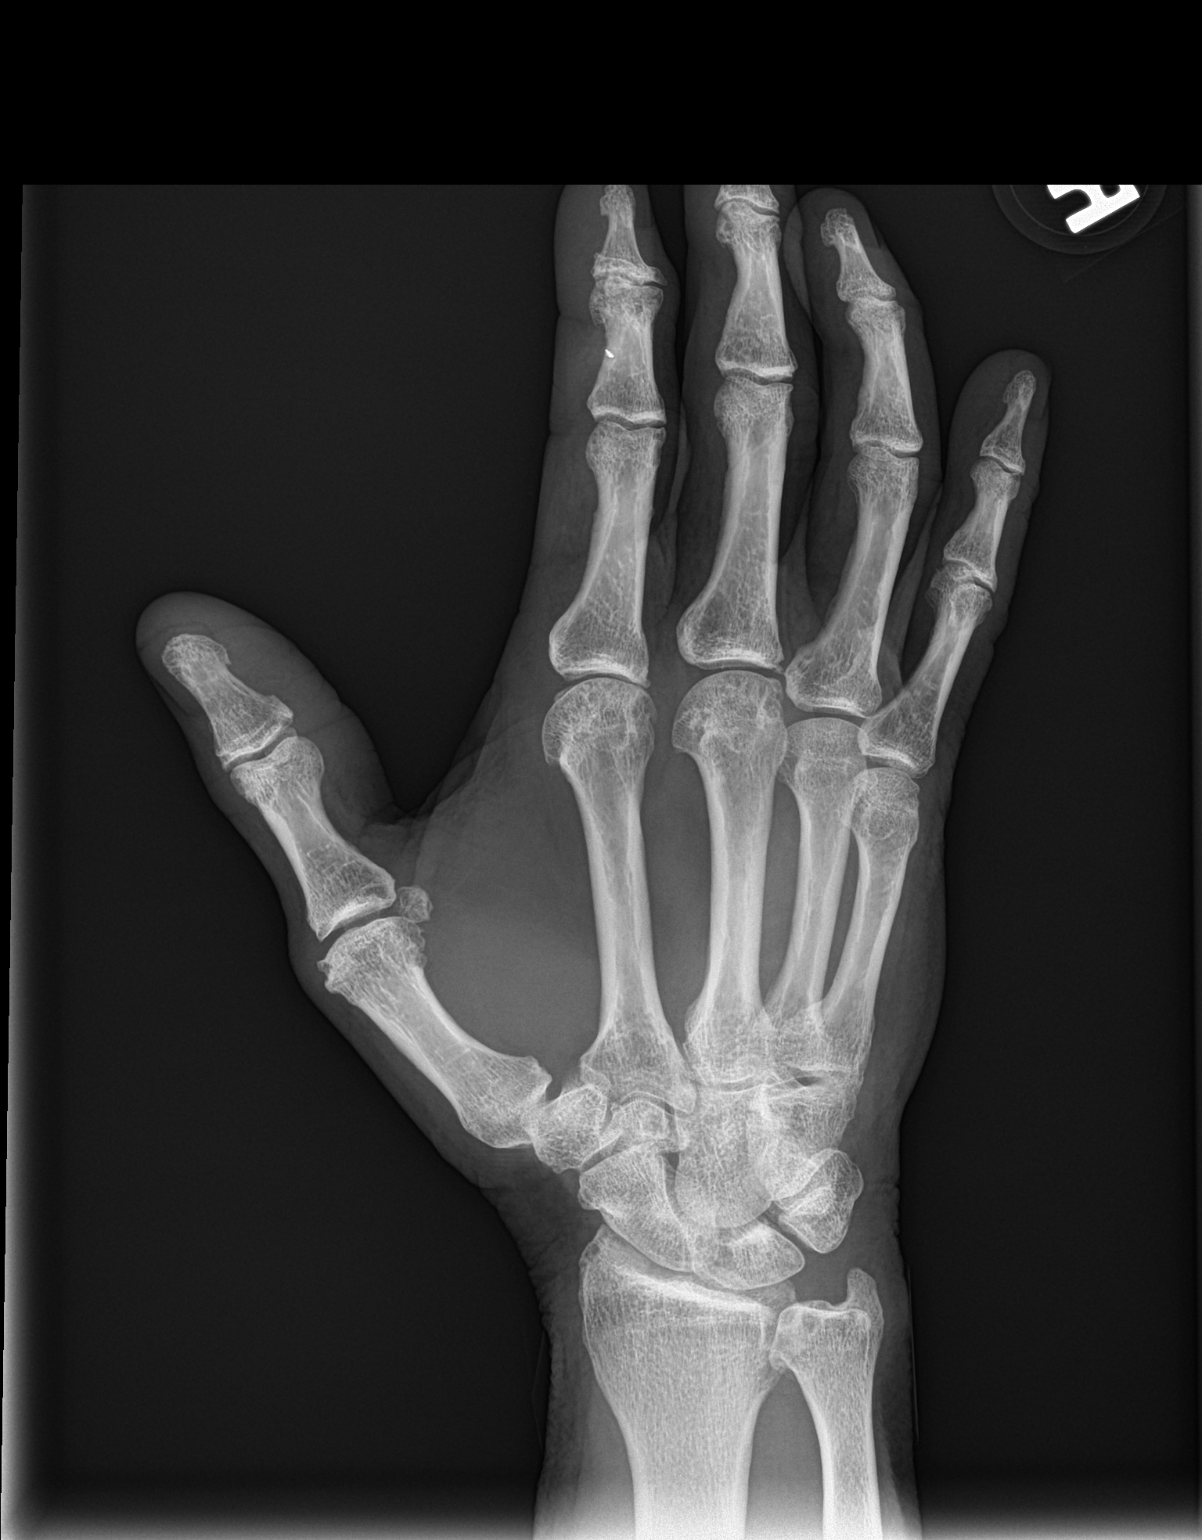

[hand lat]
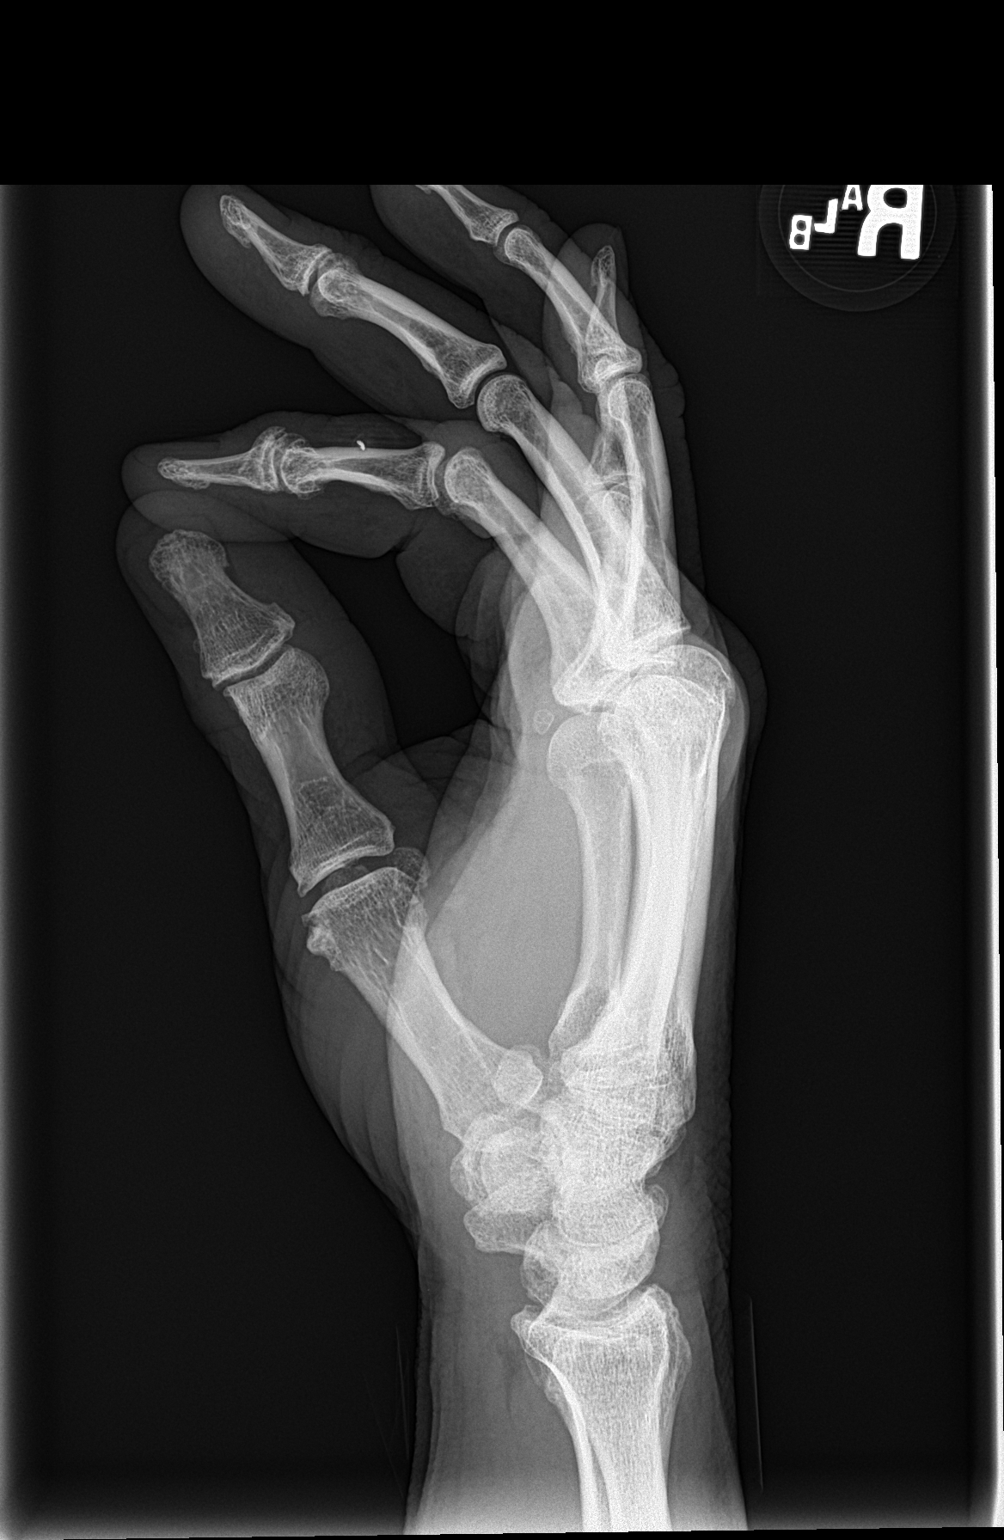

[3 of 3 positions shown; findings below may reference images not displayed]

FINDINGS: Bones: No fracture or dislocation. Generalized osteopenia. Old
ununited fracture of the left ulnar styloid process.

Joints: Normal alignment. No erosive changes. Mild osteoarthritis of
the first, second and third MCP joint bilaterally. Mild
osteoarthritis of the first CMC joint bilaterally. Mild
osteoarthritis of the distal radioulnar joint bilaterally. Moderate
osteoarthritis of the left first IP joint. Moderate osteoarthritis
of the second right DIP joint. Mild osteoarthritis of the third
right DIP joint.

Soft tissue: No soft tissue abnormality. No radiopaque foreign body.
No subcutaneous emphysema.
IMPRESSION: Osteoarthritis of bilateral hands as described above.

## 2018-12-13 ENCOUNTER — Other Ambulatory Visit: Payer: Self-pay | Admitting: Family Medicine

## 2018-12-13 DIAGNOSIS — I10 Essential (primary) hypertension: Secondary | ICD-10-CM

## 2018-12-25 ENCOUNTER — Ambulatory Visit: Payer: Medicare HMO | Admitting: Family Medicine

## 2019-01-03 ENCOUNTER — Other Ambulatory Visit: Payer: Self-pay

## 2019-01-03 ENCOUNTER — Ambulatory Visit (INDEPENDENT_AMBULATORY_CARE_PROVIDER_SITE_OTHER): Payer: Medicare HMO | Admitting: Family Medicine

## 2019-01-03 ENCOUNTER — Encounter: Payer: Self-pay | Admitting: Family Medicine

## 2019-01-03 VITALS — BP 142/90 | HR 60 | Ht 72.0 in | Wt 208.0 lb

## 2019-01-03 DIAGNOSIS — E782 Mixed hyperlipidemia: Secondary | ICD-10-CM | POA: Diagnosis not present

## 2019-01-03 DIAGNOSIS — I10 Essential (primary) hypertension: Secondary | ICD-10-CM | POA: Diagnosis not present

## 2019-01-03 DIAGNOSIS — B356 Tinea cruris: Secondary | ICD-10-CM

## 2019-01-03 MED ORDER — AMLODIPINE BESYLATE 10 MG PO TABS: 10.0000 mg | ORAL_TABLET | Freq: Every day | ORAL | 1 refills | Status: DC

## 2019-01-03 MED ORDER — CLOTRIMAZOLE-BETAMETHASONE 1-0.05 % EX CREA: 1.0000 "application " | TOPICAL_CREAM | Freq: Two times a day (BID) | CUTANEOUS | 1 refills | Status: DC

## 2019-01-03 NOTE — Progress Notes (Signed)
Date:  01/03/2019   Name:  Angel Cummings.   DOB:  Jan 07, 1946   MRN:  [id number]   Chief Complaint: Hypertension and Rash (clotrimazole refill)  Hypertension This is a chronic problem. The current episode started more than 1 year ago. The problem is unchanged. The problem is controlled. Pertinent negatives include no anxiety, blurred vision, chest pain, headaches, malaise/fatigue, neck pain, orthopnea, palpitations, peripheral edema, PND, shortness of breath or sweats. There are no associated agents to hypertension. Risk factors for coronary artery disease include male gender and post-menopausal state. Past treatments include calcium channel blockers. The current treatment provides moderate improvement. There are no compliance problems.  There is no history of angina, kidney disease, CAD/MI, CVA, heart failure, left ventricular hypertrophy, PVD or retinopathy. There is no history of chronic renal disease, a hypertension causing med or renovascular disease.  Rash This is a recurrent problem. The current episode started more than 1 year ago. The problem has been waxing and waning since onset. The affected locations include the groin. The rash is characterized by redness and itchiness. Associated with: warm.moist. Pertinent negatives include no cough, diarrhea, fever, joint pain, shortness of breath or sore throat. Past treatments include topical steroids (antifungal). The treatment provided moderate relief.    Review of Systems  Constitutional: Negative for chills, fever and malaise/fatigue.  HENT: Negative for drooling, ear discharge, ear pain and sore throat.   Eyes: Negative for blurred vision.  Respiratory: Negative for cough, shortness of breath and wheezing.   Cardiovascular: Negative for chest pain, palpitations, orthopnea, leg swelling and PND.  Gastrointestinal: Negative for abdominal pain, blood in stool, constipation, diarrhea and nausea.  Endocrine: Negative for polydipsia.   Genitourinary: Negative for dysuria, frequency, hematuria and urgency.  Musculoskeletal: Negative for back pain, joint pain, myalgias and neck pain.  Skin: Positive for rash.  Allergic/Immunologic: Negative for environmental allergies.  Neurological: Negative for dizziness and headaches.  Hematological: Does not bruise/bleed easily.  Psychiatric/Behavioral: Negative for suicidal ideas. The patient is not nervous/anxious.     Patient Active Problem List   Diagnosis Date Noted  . Arthritis of both hands 04/17/2016  . Arthritis of hand 04/17/2016  . Essential hypertension 06/01/2015  . Hyperlipidemia 06/01/2015    Allergies  Allergen Reactions  . Etodolac   . Amoxicillin Rash    Past Surgical History:  Procedure Laterality Date  . COLONOSCOPY  2005   Dr Vickki Muff in [place] Left     Social History   Tobacco Use  . Smoking status: Never Smoker  . Smokeless tobacco: Never Used  Substance Use Topics  . Alcohol use: Yes    Alcohol/week: 0.0 standard drinks  . Drug use: No     Medication list has been reviewed and updated.  Current Meds  Medication Sig  . amLODipine (NORVASC) 10 MG tablet TAKE 1 TABLET BY MOUTH DAILY  . aspirin 81 MG tablet Take 1 tablet (81 mg total) by mouth daily.  . clotrimazole-betamethasone (LOTRISONE) cream Apply 1 application topically 2 (two) times daily.    PHQ 2/9 Scores 04/26/2018 10/24/2017 08/28/2016 06/21/2015  PHQ - 2 Score 2 0 0 0  PHQ- 9 Score 6 0 - -    BP Readings from Last 3 Encounters:  01/03/19 (!) 142/90  10/25/18 138/80  06/07/18 124/70    Physical Exam Vitals signs and nursing note reviewed.  HENT:     Head: Normocephalic.  Right Ear: Tympanic membrane, ear canal and external ear normal.     Left Ear: Tympanic membrane, ear canal and external ear normal.     Nose: Nose normal. No congestion or rhinorrhea.     Mouth/Throat:     Mouth: Mucous membranes are moist.  Eyes:      General: No scleral icterus.       Right eye: No discharge.        Left eye: No discharge.     Conjunctiva/sclera: Conjunctivae normal.     Pupils: Pupils are equal, round, and reactive to light.  Neck:     Musculoskeletal: Normal range of motion and neck supple.     Thyroid: No thyromegaly.     Vascular: No JVD.     Trachea: No tracheal deviation.  Cardiovascular:     Rate and Rhythm: Normal rate and regular rhythm.     Heart sounds: Normal heart sounds. No murmur. No friction rub. No gallop.   Pulmonary:     Effort: Pulmonary effort is normal. No respiratory distress.     Breath sounds: Normal breath sounds. No wheezing or rales.  Abdominal:     General: Bowel sounds are normal.     Palpations: Abdomen is soft. There is no mass.     Tenderness: There is no abdominal tenderness. There is no right CVA tenderness, left CVA tenderness, guarding or rebound.  Musculoskeletal: Normal range of motion.        General: No tenderness.  Lymphadenopathy:     Cervical: No cervical adenopathy.  Skin:    General: Skin is warm.     Findings: No rash.  Neurological:     Mental Status: He is alert and oriented to person, place, and time.     Cranial Nerves: No cranial nerve deficit.     Deep Tendon Reflexes: Reflexes are normal and symmetric.     Wt Readings from Last 3 Encounters:  01/03/19 208 lb (94.3 kg)  10/25/18 208 lb (94.3 kg)  06/07/18 209 lb (94.8 kg)    BP (!) 142/90   Pulse 60   Ht 6' (1.829 m)   Wt 208 lb (94.3 kg)   BMI 28.21 kg/m   Assessment and Plan: 1. Essential hypertension Chronic.  Controlled.  Will continue amlodipine 10 mg once a day.  Will check renal function panel. - Renal Function Panel - amLODipine (NORVASC) 10 MG tablet; Take 1 tablet (10 mg total) by mouth daily.  Dispense: 90 tablet; Refill: 1  2. Tinea inguinalis Patient with tinea inguinalis because of the frequent mowing and sweating in the groin and genitalia area.  Will use Lotrisone on a as  needed basis which has been controlling this in the past very well. - clotrimazole-betamethasone (LOTRISONE) cream; Apply 1 application topically 2 (two) times daily.  Dispense: 30 g; Refill: 1  3. Mixed hyperlipidemia Chronic.  Controlled.  Continue with diet control and will check lipid panel for evaluation. - Lipid Panel With LDL/HDL Ratio

## 2019-01-04 LAB — RENAL FUNCTION PANEL
Albumin: 4.4 g/dL (ref 3.8–4.8)
BUN/Creatinine Ratio: 14 (ref 10–24)
BUN: 13 mg/dL (ref 8–27)
CO2: 22 mmol/L (ref 20–29)
Calcium: 9.2 mg/dL (ref 8.6–10.2)
Chloride: 106 mmol/L (ref 96–106)
Creatinine, Ser: 0.93 mg/dL (ref 0.76–1.27)
GFR calc Af Amer: 96 mL/min/{1.73_m2} (ref >59)
GFR calc non Af Amer: 83 mL/min/{1.73_m2} (ref >59)
Glucose: 97 mg/dL (ref 65–99)
Phosphorus: 3.3 mg/dL (ref 2.8–4.1)
Potassium: 4.1 mmol/L (ref 3.5–5.2)
Sodium: 143 mmol/L (ref 134–144)

## 2019-01-04 LAB — LIPID PANEL WITH LDL/HDL RATIO
Cholesterol, Total: 217 mg/dL — ABNORMAL HIGH (ref 100–199)
HDL: 42 mg/dL (ref >39)
LDL Calculated: 139 mg/dL — ABNORMAL HIGH (ref 0–99)
LDl/HDL Ratio: 3.3 ratio (ref 0.0–3.6)
Triglycerides: 179 mg/dL — ABNORMAL HIGH (ref 0–149)
VLDL Cholesterol Cal: 36 mg/dL (ref 5–40)

## 2019-05-28 DIAGNOSIS — R69 Illness, unspecified: Secondary | ICD-10-CM | POA: Diagnosis not present

## 2019-06-23 DIAGNOSIS — R69 Illness, unspecified: Secondary | ICD-10-CM | POA: Diagnosis not present

## 2019-07-10 IMAGING — CR DG FOREARM 2V*R*
2 series · 2 of 2 positions shown · non-contrast
Comparison: None.

CLINICAL DATA: Kicked by a horse with right forearm pain. Initial
encounter.

EXAM:
RIGHT FOREARM - 2 VIEW

[forearm ap]
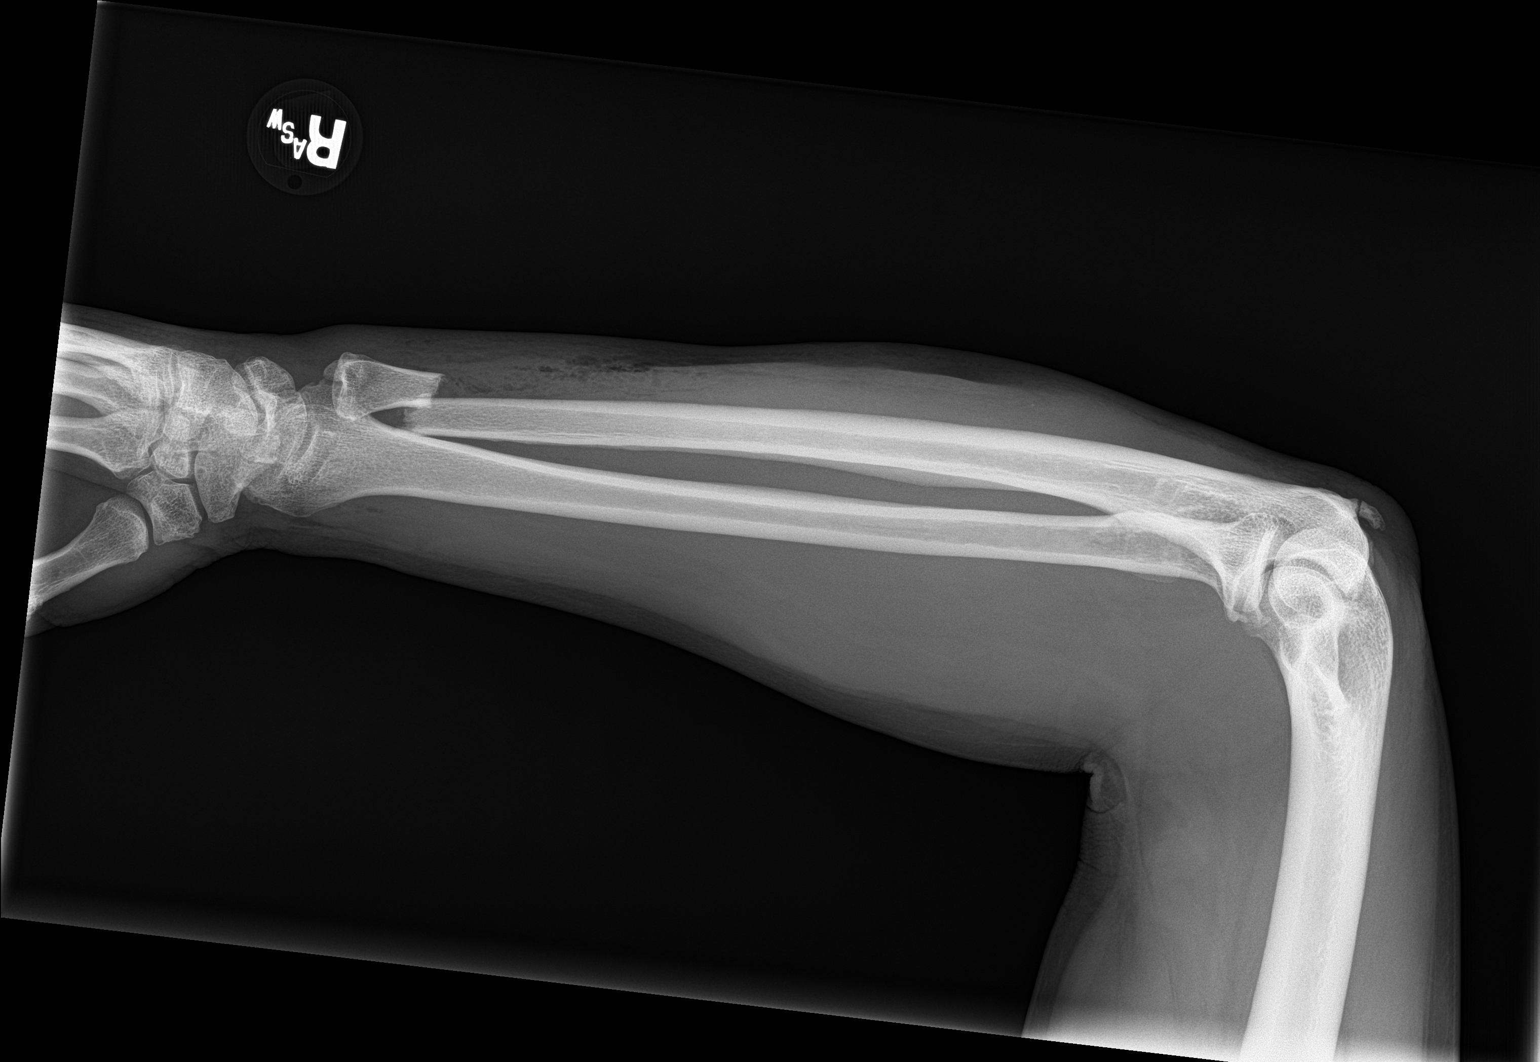

[forearm lat]
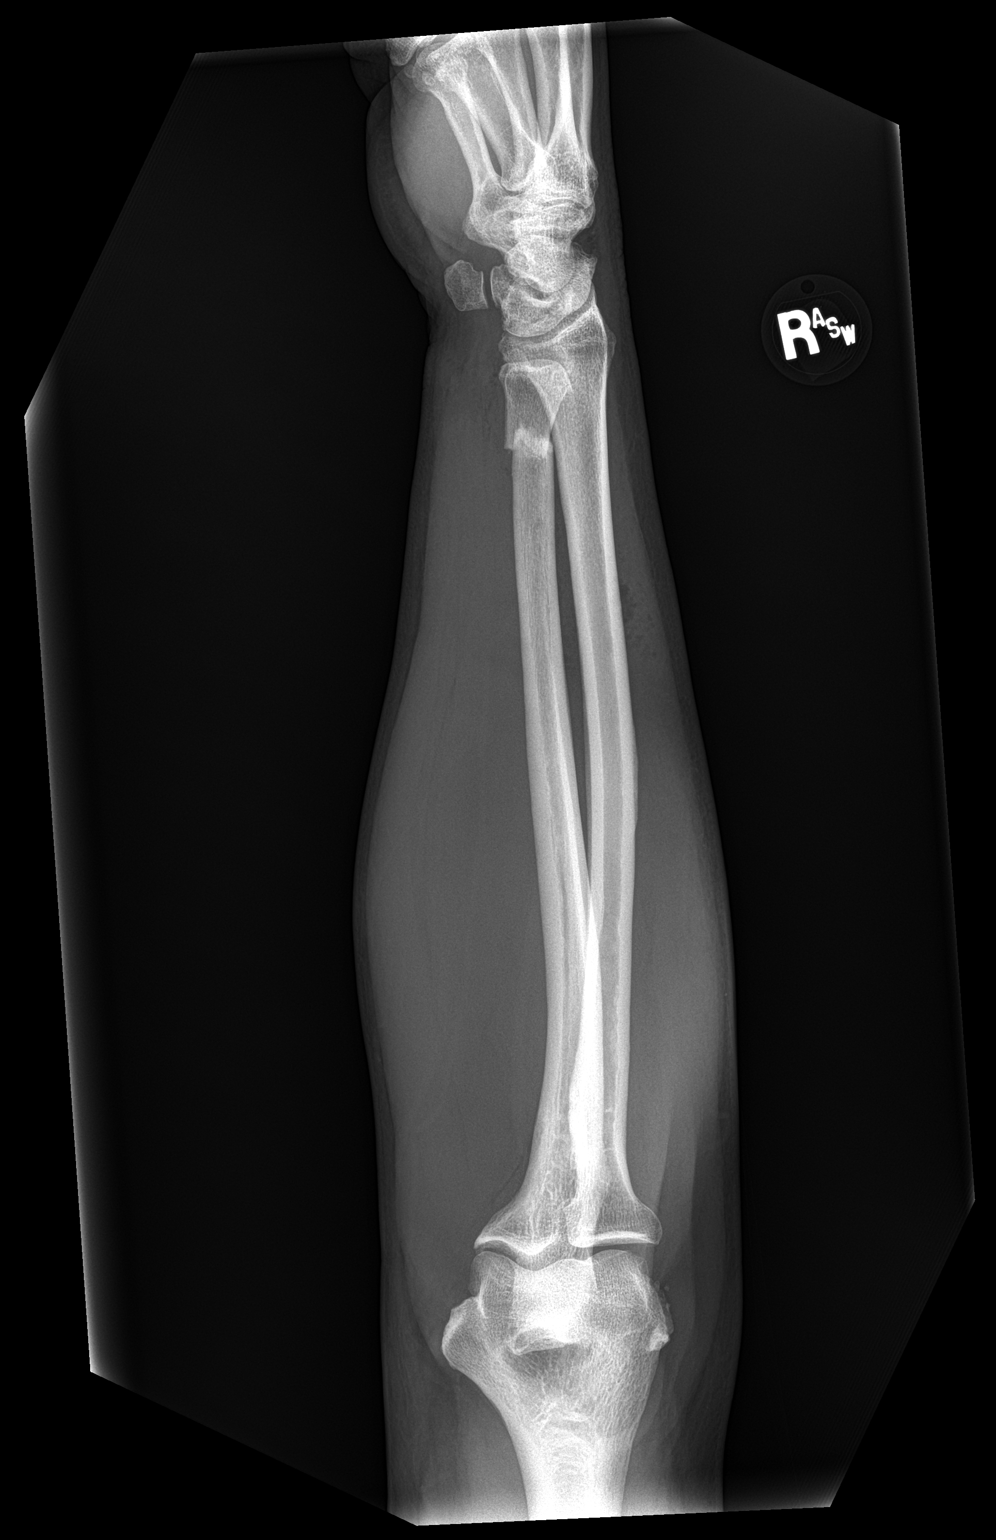

[2 of 2 positions shown; findings below may reference images not displayed]

FINDINGS: Given patient's condition, a true AP view could not be obtained.

There is a transverse fracture of the distal ulnar metadiaphysis
with 100% dorsal displacement. Soft tissue emphysema about the
fracture suggesting open injury. Distal radiocarpal joint alignment
is uncertain on these views. No additional fracture.
IMPRESSION: 1. Displaced distal ulnar metadiaphysis fracture with soft tissue
gas.
2. Limited positioning due to condition. Uncertain distal radioulnar
joint alignment.

## 2019-07-29 DIAGNOSIS — D485 Neoplasm of uncertain behavior of skin: Secondary | ICD-10-CM | POA: Diagnosis not present

## 2019-07-29 DIAGNOSIS — C44319 Basal cell carcinoma of skin of other parts of face: Secondary | ICD-10-CM | POA: Diagnosis not present

## 2019-07-29 DIAGNOSIS — C44519 Basal cell carcinoma of skin of other part of trunk: Secondary | ICD-10-CM | POA: Diagnosis not present

## 2019-09-08 ENCOUNTER — Other Ambulatory Visit: Payer: Self-pay | Admitting: Family Medicine

## 2019-09-08 DIAGNOSIS — I10 Essential (primary) hypertension: Secondary | ICD-10-CM

## 2019-09-08 NOTE — Telephone Encounter (Signed)
Pt. Has appointment for next week.

## 2019-09-16 ENCOUNTER — Other Ambulatory Visit: Payer: Self-pay

## 2019-09-16 ENCOUNTER — Ambulatory Visit (INDEPENDENT_AMBULATORY_CARE_PROVIDER_SITE_OTHER): Payer: Medicare HMO | Admitting: Family Medicine

## 2019-09-16 ENCOUNTER — Encounter: Payer: Self-pay | Admitting: Family Medicine

## 2019-09-16 VITALS — BP 128/80 | HR 80 | Ht 72.0 in | Wt 215.0 lb

## 2019-09-16 DIAGNOSIS — M21611 Bunion of right foot: Secondary | ICD-10-CM

## 2019-09-16 DIAGNOSIS — I10 Essential (primary) hypertension: Secondary | ICD-10-CM

## 2019-09-16 DIAGNOSIS — E782 Mixed hyperlipidemia: Secondary | ICD-10-CM | POA: Diagnosis not present

## 2019-09-16 DIAGNOSIS — R69 Illness, unspecified: Secondary | ICD-10-CM | POA: Diagnosis not present

## 2019-09-16 DIAGNOSIS — F4323 Adjustment disorder with mixed anxiety and depressed mood: Secondary | ICD-10-CM | POA: Diagnosis not present

## 2019-09-16 MED ORDER — AMLODIPINE BESYLATE 10 MG PO TABS: 10.0000 mg | ORAL_TABLET | Freq: Every day | ORAL | 1 refills | Status: DC

## 2019-09-16 MED ORDER — ATORVASTATIN CALCIUM 10 MG PO TABS: 10.0000 mg | ORAL_TABLET | Freq: Every day | ORAL | 1 refills | Status: DC

## 2019-09-16 MED ORDER — SERTRALINE HCL 25 MG PO TABS: 25.0000 mg | ORAL_TABLET | Freq: Every day | ORAL | 0 refills | Status: DC

## 2019-09-16 NOTE — Progress Notes (Signed)
Date:  09/16/2019   Name:  Angel Cummings.   DOB:  1945/06/13   MRN:  [id number]   Chief Complaint: Hypertension and Hyperlipidemia (was supposed to call atorv)  Hypertension This is a chronic problem. The current episode started more than 1 year ago. The problem is unchanged. The problem is controlled. Associated symptoms include anxiety. Pertinent negatives include no blurred vision, chest pain, headaches, malaise/fatigue, neck pain, orthopnea, palpitations, peripheral edema, PND, shortness of breath or sweats. There are no associated agents to hypertension. There are no known risk factors for coronary artery disease. Past treatments include calcium channel blockers. The current treatment provides moderate improvement. There are no compliance problems.  There is no history of angina, kidney disease, CAD/MI, CVA, heart failure, left ventricular hypertrophy or PVD. There is no history of chronic renal disease, a hypertension causing med or renovascular disease.  Hyperlipidemia This is a chronic problem. The current episode started more than 1 year ago. The problem is controlled. Recent lipid tests were reviewed and are normal. He has no history of chronic renal disease, diabetes, hypothyroidism, liver disease, obesity or nephrotic syndrome. Pertinent negatives include no chest pain, focal sensory loss, focal weakness, leg pain, myalgias or shortness of breath. Current antihyperlipidemic treatment includes diet change. There are no compliance problems.  Risk factors for coronary artery disease include post-menopausal, dyslipidemia, hypertension and male sex.  Depression        This is a new problem.  The current episode started more than 1 month ago.   The onset quality is gradual.   The problem has been waxing and waning since onset.  Associated symptoms include decreased concentration, fatigue, insomnia, irritable, restlessness, decreased interest and sad.  Associated symptoms include no  helplessness, no hopelessness, no appetite change, no body aches, no myalgias, no headaches, no indigestion and no suicidal ideas.  Past treatments include nothing.  Compliance with treatment is variable.  Previous treatment provided mild relief.  Past medical history includes anxiety.     Pertinent negatives include no hypothyroidism. Anxiety Presents for follow-up visit. Symptoms include decreased concentration, insomnia, nervous/anxious behavior and restlessness. Patient reports no chest pain, dizziness, nausea, palpitations, shortness of breath or suicidal ideas. Symptoms occur most days.    Leg Pain  The pain is present in the right toes. The pain is at a severity of 2/10. The pain is mild. The pain has been fluctuating since onset. Pertinent negatives include no inability to bear weight, loss of sensation or numbness. Treatments tried: topical sialicylic     Lab Results  Component Value Date   CREATININE 0.93 01/03/2019   BUN 13 01/03/2019   NA 143 01/03/2019   K 4.1 01/03/2019   CL 106 01/03/2019   CO2 22 01/03/2019   Lab Results  Component Value Date   CHOL 217 (H) 01/03/2019   HDL 42 01/03/2019   LDLCALC 139 (H) 01/03/2019   TRIG 179 (H) 01/03/2019   CHOLHDL 5.0 06/25/2017   No results found for: TSH No results found for: HGBA1C No results found for: WBC, HGB, HCT, MCV, PLT No results found for: ALT, AST, GGT, ALKPHOS, BILITOT   Review of Systems  Constitutional: Positive for fatigue. Negative for appetite change, chills, fever and malaise/fatigue.  HENT: Negative for drooling, ear discharge, ear pain and sore throat.   Eyes: Negative for blurred vision.  Respiratory: Negative for cough, shortness of breath and wheezing.   Cardiovascular: Negative for chest pain, palpitations, orthopnea, leg swelling and PND.  Gastrointestinal: Negative for abdominal pain, blood in stool, constipation, diarrhea and nausea.  Endocrine: Negative for polydipsia.  Genitourinary: Negative  for dysuria, frequency, hematuria and urgency.  Musculoskeletal: Negative for back pain, myalgias and neck pain.  Skin: Negative for rash.  Allergic/Immunologic: Negative for environmental allergies.  Neurological: Negative for dizziness, focal weakness, numbness and headaches.  Hematological: Does not bruise/bleed easily.  Psychiatric/Behavioral: Positive for decreased concentration and depression. Negative for suicidal ideas. The patient is nervous/anxious and has insomnia.     Patient Active Problem List   Diagnosis Date Noted  . Arthritis of both hands 04/17/2016  . Arthritis of hand 04/17/2016  . Essential hypertension 06/01/2015  . Hyperlipidemia 06/01/2015    Allergies  Allergen Reactions  . Etodolac   . Amoxicillin Rash    Past Surgical History:  Procedure Laterality Date  . COLONOSCOPY  2005   Dr Vickki Muff in [place] Left     Social History   Tobacco Use  . Smoking status: Never Smoker  . Smokeless tobacco: Never Used  Substance Use Topics  . Alcohol use: Yes    Alcohol/week: 0.0 standard drinks  . Drug use: No     Medication list has been reviewed and updated.  Current Meds  Medication Sig  . amLODipine (NORVASC) 10 MG tablet TAKE 1 TABLET BY MOUTH DAILY  . aspirin 81 MG tablet Take 1 tablet (81 mg total) by mouth daily.  . clotrimazole-betamethasone (LOTRISONE) cream Apply 1 application topically 2 (two) times daily.  Marland Kitchen ibuprofen (ADVIL,MOTRIN) 200 MG tablet Take 1 tablet (200 mg total) by mouth every 6 (six) hours as needed.    PHQ 2/9 Scores 09/16/2019 04/26/2018 10/24/2017 08/28/2016  PHQ - 2 Score 4 2 0 0  PHQ- 9 Score 9 6 0 -    BP Readings from Last 3 Encounters:  09/16/19 128/80  01/03/19 (!) 142/90  10/25/18 138/80    Physical Exam Vitals and nursing note reviewed.  Constitutional:      General: He is irritable.  HENT:     Head: Normocephalic.     Right Ear: Tympanic membrane, ear canal and external  ear normal.     Left Ear: Tympanic membrane, ear canal and external ear normal.     Nose: Nose normal.  Eyes:     General: No scleral icterus.       Right eye: No discharge.        Left eye: No discharge.     Conjunctiva/sclera: Conjunctivae normal.     Pupils: Pupils are equal, round, and reactive to light.  Neck:     Thyroid: No thyromegaly.     Vascular: No JVD.     Trachea: No tracheal deviation.  Cardiovascular:     Rate and Rhythm: Normal rate and regular rhythm.     Heart sounds: Normal heart sounds. No murmur. No friction rub. No gallop.   Pulmonary:     Effort: No respiratory distress.     Breath sounds: Normal breath sounds. No wheezing or rales.  Abdominal:     General: Bowel sounds are normal.     Palpations: Abdomen is soft. There is no mass.     Tenderness: There is no abdominal tenderness. There is no guarding or rebound.  Musculoskeletal:        General: No tenderness. Normal range of motion.     Cervical back: Normal range of motion and neck supple.  Lymphadenopathy:  Cervical: No cervical adenopathy.  Skin:    General: Skin is warm.     Findings: No rash.  Neurological:     Mental Status: He is alert and oriented to person, place, and time.     Cranial Nerves: No cranial nerve deficit.     Deep Tendon Reflexes: Reflexes are normal and symmetric.     Wt Readings from Last 3 Encounters:  09/16/19 215 lb (97.5 kg)  01/03/19 208 lb (94.3 kg)  10/25/18 208 lb (94.3 kg)    BP 128/80   Pulse 80   Ht 6' (1.829 m)   Wt 215 lb (97.5 kg)   BMI 29.16 kg/m   Assessment and Plan:  1. Essential hypertension Chronic.  Controlled.  Stable.  Continue amlodipine 10 mg once a day. - amLODipine (NORVASC) 10 MG tablet; Take 1 tablet (10 mg total) by mouth daily.  Dispense: 90 tablet; Refill: 1  2. Mixed hyperlipidemia Chronic.  Uncontrolled.  Stable.  Patient was intended to be put on atorvastatin last visit but it was not done we will initiate atorvastatin 10  mg once a day and will wait and recheck patient in 6 weeks for below circumstance and will do lab work at that time. - atorvastatin (LIPITOR) 10 MG tablet; Take 1 tablet (10 mg total) by mouth daily.  Dispense: 90 tablet; Refill: 1  3. Bunion, right foot New onset.  Patient has had right lateral foot pain associated which seems to be a bunion of the small toe which is called in a deviation of the toe causing a callus or wart like to the inside of the fifth referral to podiatry for evaluation and treatment - Ambulatory referral to Podiatry  4. Adjustment reaction with anxiety and depression New onset.  Uncontrolled.  Relatively stable.  PHQ is 9 and gad score is 6.  Given the Covid circumstances and anxiety associated there patient has gradually gotten more depressed and having some anxiety concerns.  Start sertraline 25 mg and then recheck in 6 weeks at that see how he is tolerating with the likelihood that we will increase to 50 mg. - sertraline (ZOLOFT) 25 MG tablet; Take 1 tablet (25 mg total) by mouth daily.  Dispense: 60 tablet; Refill: 0

## 2019-10-01 DIAGNOSIS — M79671 Pain in right foot: Secondary | ICD-10-CM | POA: Diagnosis not present

## 2019-10-01 DIAGNOSIS — M2041 Other hammer toe(s) (acquired), right foot: Secondary | ICD-10-CM | POA: Diagnosis not present

## 2019-10-30 ENCOUNTER — Ambulatory Visit: Payer: Medicare HMO | Admitting: Family Medicine

## 2019-11-05 ENCOUNTER — Other Ambulatory Visit: Payer: Self-pay

## 2019-11-05 ENCOUNTER — Encounter: Payer: Self-pay | Admitting: Family Medicine

## 2019-11-05 ENCOUNTER — Ambulatory Visit (INDEPENDENT_AMBULATORY_CARE_PROVIDER_SITE_OTHER): Payer: Medicare HMO | Admitting: Family Medicine

## 2019-11-05 VITALS — BP 140/80 | HR 64 | Ht 72.0 in | Wt 207.0 lb

## 2019-11-05 DIAGNOSIS — F4323 Adjustment disorder with mixed anxiety and depressed mood: Secondary | ICD-10-CM | POA: Diagnosis not present

## 2019-11-05 DIAGNOSIS — E782 Mixed hyperlipidemia: Secondary | ICD-10-CM

## 2019-11-05 DIAGNOSIS — I10 Essential (primary) hypertension: Secondary | ICD-10-CM

## 2019-11-05 DIAGNOSIS — Z1211 Encounter for screening for malignant neoplasm of colon: Secondary | ICD-10-CM | POA: Diagnosis not present

## 2019-11-05 DIAGNOSIS — R69 Illness, unspecified: Secondary | ICD-10-CM | POA: Diagnosis not present

## 2019-11-05 MED ORDER — SERTRALINE HCL 25 MG PO TABS: 25.0000 mg | ORAL_TABLET | Freq: Every day | ORAL | 1 refills | Status: DC

## 2019-11-05 NOTE — Progress Notes (Signed)
Date:  11/05/2019   Name:  Angel Cummings.   DOB:  11/19/1945   MRN:  [id number]   Chief Complaint: Depression (PHQ9=6 and GAD7=2) and Hyperlipidemia (lipitor gives him diarrhea)  Depression        This is a chronic problem.  The current episode started more than 1 year ago.   The onset quality is gradual.   The problem occurs intermittently.  The problem has been gradually improving since onset.  Associated symptoms include no decreased concentration, no fatigue, no helplessness, no hopelessness, does not have insomnia, not irritable, no restlessness, no decreased interest, no appetite change, no body aches, no myalgias, no headaches, no indigestion, not sad and no suicidal ideas.  Past treatments include SSRIs - Selective serotonin reuptake inhibitors.  Compliance with treatment is good.   Pertinent negatives include no hypothyroidism. Hyperlipidemia This is a chronic problem. The current episode started more than 1 year ago. The problem is controlled. Recent lipid tests were reviewed and are normal. He has no history of chronic renal disease, diabetes, hypothyroidism, liver disease, obesity or nephrotic syndrome. Pertinent negatives include no chest pain, myalgias or shortness of breath. Current antihyperlipidemic treatment includes statins. The current treatment provides moderate improvement of lipids. There are no compliance problems.  Risk factors for coronary artery disease include dyslipidemia.    Lab Results  Component Value Date   CREATININE 0.93 01/03/2019   BUN 13 01/03/2019   NA 143 01/03/2019   K 4.1 01/03/2019   CL 106 01/03/2019   CO2 22 01/03/2019   Lab Results  Component Value Date   CHOL 217 (H) 01/03/2019   HDL 42 01/03/2019   LDLCALC 139 (H) 01/03/2019   TRIG 179 (H) 01/03/2019   CHOLHDL 5.0 06/25/2017   No results found for: TSH No results found for: HGBA1C No results found for: WBC, HGB, HCT, MCV, PLT No results found for: ALT, AST, GGT, ALKPHOS,  BILITOT   Review of Systems  Constitutional: Negative for appetite change, chills, fatigue and fever.  HENT: Negative for drooling, ear discharge, ear pain and sore throat.   Respiratory: Negative for cough, shortness of breath and wheezing.   Cardiovascular: Negative for chest pain, palpitations and leg swelling.  Gastrointestinal: Negative for abdominal pain, blood in stool, constipation, diarrhea and nausea.  Endocrine: Negative for polydipsia.  Genitourinary: Negative for dysuria, frequency, hematuria and urgency.  Musculoskeletal: Negative for back pain, myalgias and neck pain.  Skin: Negative for rash.  Allergic/Immunologic: Negative for environmental allergies.  Neurological: Negative for dizziness and headaches.  Hematological: Does not bruise/bleed easily.  Psychiatric/Behavioral: Positive for depression. Negative for decreased concentration and suicidal ideas. The patient is not nervous/anxious and does not have insomnia.     Patient Active Problem List   Diagnosis Date Noted  . Arthritis of both hands 04/17/2016  . Arthritis of hand 04/17/2016  . Essential hypertension 06/01/2015  . Hyperlipidemia 06/01/2015    Allergies  Allergen Reactions  . Etodolac   . Amoxicillin Rash    Past Surgical History:  Procedure Laterality Date  . COLONOSCOPY  2005   Dr Vickki Muff in [place] Left     Social History   Tobacco Use  . Smoking status: Never Smoker  . Smokeless tobacco: Never Used  Substance Use Topics  . Alcohol use: Yes    Alcohol/week: 0.0 standard drinks  . Drug use: No     Medication list has been  reviewed and updated.  Current Meds  Medication Sig  . amLODipine (NORVASC) 10 MG tablet Take 1 tablet (10 mg total) by mouth daily.  Marland Kitchen aspirin 81 MG tablet Take 1 tablet (81 mg total) by mouth daily.  Marland Kitchen atorvastatin (LIPITOR) 10 MG tablet Take 1 tablet (10 mg total) by mouth daily.  . clotrimazole-betamethasone (LOTRISONE)  cream Apply 1 application topically 2 (two) times daily.  Marland Kitchen ibuprofen (ADVIL,MOTRIN) 200 MG tablet Take 1 tablet (200 mg total) by mouth every 6 (six) hours as needed.  . sertraline (ZOLOFT) 25 MG tablet Take 1 tablet (25 mg total) by mouth daily.    PHQ 2/9 Scores 11/05/2019 09/16/2019 04/26/2018 10/24/2017  PHQ - 2 Score 0 4 2 0  PHQ- 9 Score 6 9 6  0    GAD 7 : Generalized Anxiety Score 11/05/2019 09/16/2019  Nervous, Anxious, on Edge 0 2  Control/stop worrying 0 2  Worry too much - different things 0 2  Trouble relaxing 2 0  Restless 0 0  Easily annoyed or irritable 0 0  Afraid - awful might happen 0 0  Total GAD 7 Score 2 6  Anxiety Difficulty Not difficult at all Not difficult at all    BP Readings from Last 3 Encounters:  11/05/19 140/80  09/16/19 128/80  01/03/19 (!) 142/90    Physical Exam Vitals and nursing note reviewed.  Constitutional:      General: He is not irritable. HENT:     Head: Normocephalic.     Right Ear: Tympanic membrane, ear canal and external ear normal.     Left Ear: Tympanic membrane, ear canal and external ear normal.     Nose: Nose normal. No congestion or rhinorrhea.  Eyes:     General: No scleral icterus.       Right eye: No discharge.        Left eye: No discharge.     Conjunctiva/sclera: Conjunctivae normal.     Pupils: Pupils are equal, round, and reactive to light.  Neck:     Thyroid: No thyromegaly.     Vascular: No JVD.     Trachea: No tracheal deviation.  Cardiovascular:     Rate and Rhythm: Normal rate and regular rhythm.     Heart sounds: Normal heart sounds. No murmur heard.  No friction rub. No gallop.   Pulmonary:     Effort: No respiratory distress.     Breath sounds: Normal breath sounds. No wheezing, rhonchi or rales.  Abdominal:     General: Bowel sounds are normal.     Palpations: Abdomen is soft. There is no mass.     Tenderness: There is no abdominal tenderness. There is no guarding or rebound.  Musculoskeletal:         General: No tenderness. Normal range of motion.     Cervical back: Normal range of motion and neck supple.  Lymphadenopathy:     Cervical: No cervical adenopathy.  Skin:    General: Skin is warm.     Findings: No rash.  Neurological:     Mental Status: He is alert and oriented to person, place, and time.     Cranial Nerves: No cranial nerve deficit.     Deep Tendon Reflexes: Reflexes are normal and symmetric.     Wt Readings from Last 3 Encounters:  11/05/19 207 lb (93.9 kg)  09/16/19 215 lb (97.5 kg)  01/03/19 208 lb (94.3 kg)    BP 140/80   Pulse 64  Ht 6' (1.829 m)   Wt 207 lb (93.9 kg)   BMI 28.07 kg/m   Assessment and Plan: 1. Adjustment reaction with anxiety and depression Chronic.  Controlled on medication.  Stable.  Patient tolerating sertraline 25 mg and PHQ has improved to 6 with a gad score of 0.  We will continue sertraline 25 mg once a day. - sertraline (ZOLOFT) 25 MG tablet; Take 1 tablet (25 mg total) by mouth daily.  Dispense: 90 tablet; Refill: 1  2. Mixed hyperlipidemia Chronic.  Controlled.  Stable.  Patient does have some diarrhea that seems to be associated with statin.  Patient is able to control this with skipping statin for 24 to 48 hours.  We will check a lipid panel to see if this is going to be a sustainable pattern. - Lipid Panel With LDL/HDL Ratio  3. Colon cancer screening Discussed with patient.  We cannot find any evidence of previous colonoscopy and patient denies that he had one in 2017.  Will refer to gastroenterology for evaluation. - Ambulatory referral to Gastroenterology  4. Essential hypertension Chronic.  Controlled.  Stable.  Patient is on current medication regimen of amlodipine 10 mg once a day.  Patient is tolerating this and it was continued last visit.  We do need a renal function panel which was not done at that time with anticipation of having a lipid panel today. - Renal Function Panel

## 2019-11-06 LAB — LIPID PANEL WITH LDL/HDL RATIO
Cholesterol, Total: 204 mg/dL — ABNORMAL HIGH (ref 100–199)
HDL: 46 mg/dL (ref >39)
LDL Chol Calc (NIH): 127 mg/dL — ABNORMAL HIGH (ref 0–99)
LDL/HDL Ratio: 2.8 ratio (ref 0.0–3.6)
Triglycerides: 175 mg/dL — ABNORMAL HIGH (ref 0–149)
VLDL Cholesterol Cal: 31 mg/dL (ref 5–40)

## 2019-11-06 LAB — RENAL FUNCTION PANEL
Albumin: 4.4 g/dL (ref 3.7–4.7)
BUN/Creatinine Ratio: 15 (ref 10–24)
BUN: 18 mg/dL (ref 8–27)
CO2: 21 mmol/L (ref 20–29)
Calcium: 9.5 mg/dL (ref 8.6–10.2)
Chloride: 105 mmol/L (ref 96–106)
Creatinine, Ser: 1.17 mg/dL (ref 0.76–1.27)
GFR calc Af Amer: 72 mL/min/{1.73_m2} (ref >59)
GFR calc non Af Amer: 62 mL/min/{1.73_m2} (ref >59)
Glucose: 111 mg/dL — ABNORMAL HIGH (ref 65–99)
Phosphorus: 3.4 mg/dL (ref 2.8–4.1)
Potassium: 3.9 mmol/L (ref 3.5–5.2)
Sodium: 143 mmol/L (ref 134–144)

## 2019-11-12 ENCOUNTER — Other Ambulatory Visit: Payer: Self-pay

## 2019-11-12 DIAGNOSIS — Z1211 Encounter for screening for malignant neoplasm of colon: Secondary | ICD-10-CM

## 2019-11-13 ENCOUNTER — Other Ambulatory Visit: Payer: Self-pay

## 2019-11-24 ENCOUNTER — Other Ambulatory Visit: Payer: Self-pay

## 2019-11-24 ENCOUNTER — Other Ambulatory Visit
Admission: RE | Admit: 2019-11-24 | Discharge: 2019-11-24 | Disposition: A | Discharge status: Home / Self Care | Payer: Medicare HMO | Attending: Family Medicine | Admitting: Family Medicine

## 2019-11-24 ENCOUNTER — Encounter: Payer: Self-pay | Admitting: Family Medicine

## 2019-11-24 ENCOUNTER — Ambulatory Visit (INDEPENDENT_AMBULATORY_CARE_PROVIDER_SITE_OTHER): Payer: Medicare HMO | Admitting: Family Medicine

## 2019-11-24 ENCOUNTER — Ambulatory Visit: Payer: Self-pay | Admitting: *Deleted

## 2019-11-24 VITALS — BP 124/52 | HR 60 | Temp 98.6°F | Ht 72.0 in | Wt 206.0 lb

## 2019-11-24 DIAGNOSIS — E876 Hypokalemia: Secondary | ICD-10-CM | POA: Insufficient documentation

## 2019-11-24 DIAGNOSIS — E860 Dehydration: Secondary | ICD-10-CM

## 2019-11-24 DIAGNOSIS — I10 Essential (primary) hypertension: Secondary | ICD-10-CM | POA: Insufficient documentation

## 2019-11-24 DIAGNOSIS — R197 Diarrhea, unspecified: Secondary | ICD-10-CM | POA: Insufficient documentation

## 2019-11-24 DIAGNOSIS — E875 Hyperkalemia: Secondary | ICD-10-CM | POA: Insufficient documentation

## 2019-11-24 LAB — CBC
HCT: 40.9 % (ref 39.0–52.0)
Hemoglobin: 14.2 g/dL (ref 13.0–17.0)
MCH: 30.3 pg (ref 26.0–34.0)
MCHC: 34.7 g/dL (ref 30.0–36.0)
MCV: 87.2 fL (ref 80.0–100.0)
Platelets: 178 10*3/uL (ref 150–400)
RBC: 4.69 MIL/uL (ref 4.22–5.81)
RDW: 13.6 % (ref 11.5–15.5)
WBC: 3.5 10*3/uL — ABNORMAL LOW (ref 4.0–10.5)
nRBC: 0 % (ref 0.0–0.2)

## 2019-11-24 LAB — RENAL FUNCTION PANEL
Albumin: 3.8 g/dL (ref 3.5–5.0)
Anion gap: 10 (ref 5–15)
BUN: 15 mg/dL (ref 8–23)
CO2: 22 mmol/L (ref 22–32)
Calcium: 8.7 mg/dL — ABNORMAL LOW (ref 8.9–10.3)
Chloride: 105 mmol/L (ref 98–111)
Creatinine, Ser: 1.31 mg/dL — ABNORMAL HIGH (ref 0.61–1.24)
GFR calc Af Amer: >60 mL/min (ref >60)
GFR calc non Af Amer: 54 mL/min — ABNORMAL LOW (ref >60)
Glucose, Bld: 110 mg/dL — ABNORMAL HIGH (ref 70–99)
Phosphorus: 2.8 mg/dL (ref 2.5–4.6)
Potassium: 2.9 mmol/L — ABNORMAL LOW (ref 3.5–5.1)
Sodium: 137 mmol/L (ref 135–145)

## 2019-11-24 MED ORDER — POTASSIUM CHLORIDE ER 10 MEQ PO TBCR: 20.0000 meq | EXTENDED_RELEASE_TABLET | Freq: Every day | ORAL | 1 refills | Status: DC

## 2019-11-24 NOTE — Progress Notes (Signed)
Date:  11/24/2019   Name:  Angel Cummings.   DOB:  [birth year]   MRN:  [id number]   Chief Complaint: Diarrhea (started Friday night with diarrhea and Saturday had fever with diarrhea. Sunday with water- diarrhea. feels weak.)  Diarrhea  This is a new problem. The current episode started in the past 7 days (saturday). The problem occurs 5 to 10 times per day. The problem has been unchanged. The stool consistency is described as watery. The patient states that diarrhea does not awaken him from sleep. Associated symptoms include arthralgias, chills and a fever. Pertinent negatives include no abdominal pain, coughing, headaches, myalgias, URI or vomiting. Associated symptoms comments: 99 degree fever/ .    Lab Results  Component Value Date   CREATININE 1.17 11/05/2019   BUN 18 11/05/2019   NA 143 11/05/2019   K 3.9 11/05/2019   CL 105 11/05/2019   CO2 21 11/05/2019   Lab Results  Component Value Date   CHOL 204 (H) 11/05/2019   HDL 46 11/05/2019   LDLCALC 127 (H) 11/05/2019   TRIG 175 (H) 11/05/2019   CHOLHDL 5.0 06/25/2017   No results found for: TSH No results found for: HGBA1C No results found for: WBC, HGB, HCT, MCV, PLT No results found for: ALT, AST, GGT, ALKPHOS, BILITOT   Review of Systems  Constitutional: Positive for chills and fever.  HENT: Negative for drooling, ear discharge, ear pain and sore throat.   Respiratory: Negative for cough, shortness of breath and wheezing.   Cardiovascular: Negative for chest pain, palpitations and leg swelling.  Gastrointestinal: Positive for diarrhea. Negative for abdominal distention, abdominal pain, blood in stool, constipation, nausea and vomiting.  Endocrine: Negative for polydipsia.  Genitourinary: Negative for dysuria, frequency, hematuria and urgency.  Musculoskeletal: Positive for arthralgias. Negative for back pain, myalgias and neck pain.  Skin: Negative for rash.  Allergic/Immunologic: Negative for environmental  allergies.  Neurological: Negative for dizziness and headaches.  Hematological: Does not bruise/bleed easily.  Psychiatric/Behavioral: Negative for suicidal ideas. The patient is not nervous/anxious.     Patient Active Problem List   Diagnosis Date Noted  . Cubital tunnel syndrome on right 07/18/2017  . Radiculopathy of cervical region 07/18/2017  . Acute postoperative pain 12/21/2016  . Open fracture of shaft of right ulna 12/20/2016  . Arthritis of both hands 04/17/2016  . Arthritis of hand 04/17/2016  . Essential hypertension 06/01/2015  . Hyperlipidemia 06/01/2015    Allergies  Allergen Reactions  . Etodolac   . Amoxicillin Rash    Past Surgical History:  Procedure Laterality Date  . COLONOSCOPY  2005   Dr Vickki Muff in [place] Left     Social History   Tobacco Use  . Smoking status: Never Smoker  . Smokeless tobacco: Never Used  Substance Use Topics  . Alcohol use: Yes    Alcohol/week: 0.0 standard drinks  . Drug use: No     Medication list has been reviewed and updated.  Current Meds  Medication Sig  . amLODipine (NORVASC) 10 MG tablet Take 1 tablet (10 mg total) by mouth daily.  Marland Kitchen aspirin 81 MG tablet Take 1 tablet (81 mg total) by mouth daily.  . clotrimazole-betamethasone (LOTRISONE) cream Apply 1 application topically 2 (two) times daily.    PHQ 2/9 Scores 11/24/2019 11/05/2019 09/16/2019 04/26/2018  PHQ - 2 Score 1 0 4 2  PHQ- 9 Score 3 6 9  6  GAD 7 : Generalized Anxiety Score 11/24/2019 11/05/2019 09/16/2019  Nervous, Anxious, on Edge 0 0 2  Control/stop worrying 0 0 2  Worry too much - different things 0 0 2  Trouble relaxing 0 2 0  Restless 0 0 0  Easily annoyed or irritable 0 0 0  Afraid - awful might happen 0 0 0  Total GAD 7 Score 0 2 6  Anxiety Difficulty - Not difficult at all Not difficult at all    BP Readings from Last 3 Encounters:  11/24/19 (!) 124/52  11/05/19 140/80  09/16/19 128/80     Physical Exam Vitals and nursing note reviewed.  HENT:     Head: Normocephalic.     Jaw: There is normal jaw occlusion. No swelling.     Right Ear: Tympanic membrane, ear canal and external ear normal.     Left Ear: Ear canal and external ear normal.     Nose: Nose normal.     Mouth/Throat:     Lips: Pink.     Mouth: Mucous membranes are dry.  Eyes:     General: No scleral icterus.       Right eye: No discharge.        Left eye: No discharge.     Conjunctiva/sclera: Conjunctivae normal.     Pupils: Pupils are equal, round, and reactive to light.  Neck:     Thyroid: No thyromegaly.     Vascular: No JVD.     Trachea: No tracheal deviation.  Cardiovascular:     Rate and Rhythm: Normal rate and regular rhythm.     Heart sounds: Normal heart sounds. No murmur heard.  No friction rub. No gallop.   Pulmonary:     Effort: No respiratory distress.     Breath sounds: Normal breath sounds. No wheezing or rales.  Abdominal:     General: Bowel sounds are normal. There is no distension.     Palpations: Abdomen is soft. There is no hepatomegaly, splenomegaly or mass.     Tenderness: There is no abdominal tenderness. There is no right CVA tenderness, left CVA tenderness, guarding or rebound.  Musculoskeletal:        General: No tenderness. Normal range of motion.     Cervical back: Normal range of motion and neck supple.  Lymphadenopathy:     Cervical: No cervical adenopathy.  Skin:    General: Skin is warm.     Findings: No rash.  Neurological:     Mental Status: He is alert and oriented to person, place, and time.     Cranial Nerves: No cranial nerve deficit.     Deep Tendon Reflexes: Reflexes are normal and symmetric.     Wt Readings from Last 3 Encounters:  11/24/19 206 lb (93.4 kg)  11/05/19 207 lb (93.9 kg)  09/16/19 215 lb (97.5 kg)    BP (!) 124/52   Pulse 60   Temp 98.6 F (37 C) (Oral)   Ht 6' (1.829 m)   Wt 206 lb (93.4 kg)   BMI 27.94 kg/m    Assessment  and Plan: 1. Diarrhea, unspecified type Acute.  Persistent.  Primarily while awake.  Watery diarrhea without fever, chills, or myalgias.  Patient's had no exposures to antibiotics or other individuals who are ill.  We will treat as a viral enteritis symptomatically with Imodium.  Will check renal function panel to assess GFR and CBC to rule out systemic concern. - Renal Function Panel - CBC with Differential/Platelet  2. Dehydration New onset.  Mucous membranes with decreased hydration.  Persistent.  Will check renal function panel to assess GFR.  I have discussed hydration protocol and patient will proceed with rehydration.- Renal Function Panel - CBC with Differential/Platelet

## 2019-11-24 NOTE — Patient Instructions (Signed)
Diarrhea, Adult °Diarrhea is frequent loose and watery bowel movements. Diarrhea can make you feel weak and cause you to become dehydrated. Dehydration can make you tired and thirsty, cause you to have a dry mouth, and decrease how often you urinate. °Diarrhea typically lasts 2-3 days. However, it can last longer if it is a sign of something more serious. It is important to treat your diarrhea as told by your health care provider. °Follow these instructions at home: °Eating and drinking ° °  ° °Follow these recommendations as told by your health care provider: °· Take an oral rehydration solution (ORS). This is an over-the-counter medicine that helps return your body to its normal balance of nutrients and water. It is found at pharmacies and retail stores. °· Drink plenty of fluids, such as water, ice chips, diluted fruit juice, and low-calorie sports drinks. You can drink milk also, if desired. °· Avoid drinking fluids that contain a lot of sugar or caffeine, such as energy drinks, sports drinks, and soda. °· Eat bland, easy-to-digest foods in small amounts as you are able. These foods include bananas, applesauce, rice, lean meats, toast, and crackers. °· Avoid alcohol. °· Avoid spicy or fatty foods. ° °Medicines °· Take over-the-counter and prescription medicines only as told by your health care provider. °· If you were prescribed an antibiotic medicine, take it as told by your health care provider. Do not stop using the antibiotic even if you start to feel better. °General instructions ° °· Wash your hands often using soap and water. If soap and water are not available, use a hand sanitizer. Others in the household should wash their hands as well. Hands should be washed: °? After using the toilet or changing a diaper. °? Before preparing, cooking, or serving food. °? While caring for a sick person or while visiting someone in a hospital. °· Drink enough fluid to keep your urine pale yellow. °· Rest at home while  you recover. °· Watch your condition for any changes. °· Take a warm bath to relieve any burning or pain from frequent diarrhea episodes. °· Keep all follow-up visits as told by your health care provider. This is important. °Contact a health care provider if: °· You have a fever. °· Your diarrhea gets worse. °· You have new symptoms. °· You cannot keep fluids down. °· You feel light-headed or dizzy. °· You have a headache. °· You have muscle cramps. °Get help right away if: °· You have chest pain. °· You feel extremely weak or you faint. °· You have bloody or black stools or stools that look like tar. °· You have severe pain, cramping, or bloating in your abdomen. °· You have trouble breathing or you are breathing very quickly. °· Your heart is beating very quickly. °· Your skin feels cold and clammy. °· You feel confused. °· You have signs of dehydration, such as: °? Dark urine, very little urine, or no urine. °? Cracked lips. °? Dry mouth. °? Sunken eyes. °? Sleepiness. °? Weakness. °Summary °· Diarrhea is frequent loose and watery bowel movements. Diarrhea can make you feel weak and cause you to become dehydrated. °· Drink enough fluids to keep your urine pale yellow. °· Make sure that you wash your hands after using the toilet. If soap and water are not available, use hand sanitizer. °· Contact a health care provider if your diarrhea gets worse or you have new symptoms. °· Get help right away if you have signs of dehydration. °This   information is not intended to replace advice given to you by your health care provider. Make sure you discuss any questions you have with your health care provider. °Document Revised: 09/10/2018 Document Reviewed: 09/28/2017 °Elsevier Patient Education © 2020 Elsevier Inc. ° °

## 2019-11-24 NOTE — Addendum Note (Signed)
Addended by: Fredderick Severance on: 11/24/2019 01:19 PM   Modules accepted: Orders

## 2019-11-24 NOTE — Telephone Encounter (Signed)
Pt called in c/o diarrhea since Saturday morning.   Friday his abd felt bloated.  Saturday he was bloated but after vomiting Saturday morning that relieved the bloating and not had problems since. Everything he tries to eat or drink goes straight through him.  The stool was dark and watery Saturday but now it's clear.  Denies abd pain.  No more vomiting since Sat. Morning. No COVID exposures he is aware of.  COVID-19 questionnaire completed.  Due to symptoms I scheduled him a virtual visit for today with Dr. Otilio Miu at 11:00.   He does not use his MyChart account.  I verified his phone number [phone] and let him know Dr. Ronnald Ramp would be calling him.   He was agreeable to this.  I sent my notes to [facility] for Dr. Ronnald Ramp.    Reason for Disposition . [1] SEVERE diarrhea (e.g., 7 or more times / day more than normal) AND [2] present > 24 hours (1 day)  Answer Assessment - Initial Assessment Questions 1. DIARRHEA SEVERITY: "How bad is the diarrhea?" "How many extra stools have you had in the past 24 hours than normal?"    - NO DIARRHEA (SCALE 0)   - MILD (SCALE 1-3): Few loose or mushy BMs; increase of 1-3 stools over normal daily number of stools; mild increase in ostomy output.   -  MODERATE (SCALE 4-7): Increase of 4-6 stools daily over normal; moderate increase in ostomy output. * SEVERE (SCALE 8-10; OR 'WORST POSSIBLE'): Increase of 7 or more stools daily over normal; moderate increase in ostomy output; incontinence.     Had diarrhea since Saturday.   Everything I eat and drink comes out.  I end up in the bathroom.  2. ONSET: "When did the diarrhea begin?"      Saturday 3. BM CONSISTENCY: "How loose or watery is the diarrhea?"      Just what ever I eat comes out.  Saturday morning very dark diarrhea when started.  Not dark now.   No coloring in stool now. 4. VOMITING: "Are you also vomiting?" If Yes, ask: "How many times in the past 24 hours?"      I vomited Sat.  Morning green and yellow stuff.   It wasn't much.   I tried to eat but it went thru me.   I'm very weak. 5. ABDOMINAL PAIN: "Are you having any abdominal pain?" If Yes, ask: "What does it feel like?" (e.g., crampy, dull, intermittent, constant)      My stomach was swollen Friday and early Sat.   After vomiting it went away. 6. ABDOMINAL PAIN SEVERITY: If present, ask: "How bad is the pain?"  (e.g., Scale 1-10; mild, moderate, or severe)   - MILD (1-3): doesn't interfere with normal activities, abdomen soft and not tender to touch    - MODERATE (4-7): interferes with normal activities or awakens from sleep, tender to touch    - SEVERE (8-10): excruciating pain, doubled over, unable to do any normal activities       No pain now 7. ORAL INTAKE: If vomiting, "Have you been able to drink liquids?" "How much fluids have you had in the past 24 hours?"     None 8. HYDRATION: "Any signs of dehydration?" (e.g., dry mouth [not just dry lips], too weak to stand, dizziness, new weight loss) "When did you last urinate?"     Very weak, no dizziness. 9. EXPOSURE: "Have you traveled to a foreign country recently?" "Have you  been exposed to anyone with diarrhea?" "Could you have eaten any food that was spoiled?"     No 10. ANTIBIOTIC USE: "Are you taking antibiotics now or have you taken antibiotics in the past 2 months?"       No 11. OTHER SYMPTOMS: "Do you have any other symptoms?" (e.g., fever, blood in stool)       No 12. PREGNANCY: "Is there any chance you are pregnant?" "When was your last menstrual period?"       N/A  Protocols used: DIARRHEA-A-AH

## 2019-11-25 ENCOUNTER — Other Ambulatory Visit: Payer: Medicare HMO

## 2019-11-25 DIAGNOSIS — R197 Diarrhea, unspecified: Secondary | ICD-10-CM

## 2019-11-26 ENCOUNTER — Other Ambulatory Visit
Admission: RE | Admit: 2019-11-26 | Discharge: 2019-11-26 | Disposition: A | Discharge status: Home / Self Care | Payer: Medicare HMO | Attending: Internal Medicine | Admitting: Internal Medicine

## 2019-11-26 ENCOUNTER — Telehealth: Payer: Self-pay | Admitting: Family Medicine

## 2019-11-26 DIAGNOSIS — R197 Diarrhea, unspecified: Secondary | ICD-10-CM | POA: Diagnosis not present

## 2019-11-26 NOTE — Telephone Encounter (Unsigned)
Copied from [place] [id number]. Topic: General - Other >> Nov 26, 2019 10:59 AM Sheran Luz wrote: Juliann Pulse with [facility] requesting to speak with CMA regarding questions about order for stool sample for patient. CB# [phone]

## 2019-11-26 NOTE — Telephone Encounter (Signed)
Picked stool samples up and took to [facility]

## 2019-11-27 ENCOUNTER — Ambulatory Visit (INDEPENDENT_AMBULATORY_CARE_PROVIDER_SITE_OTHER): Payer: Medicare HMO | Admitting: Family Medicine

## 2019-11-27 ENCOUNTER — Other Ambulatory Visit
Admission: RE | Admit: 2019-11-27 | Discharge: 2019-11-27 | Disposition: A | Discharge status: Home / Self Care | Payer: Medicare HMO | Attending: Family Medicine | Admitting: Family Medicine

## 2019-11-27 ENCOUNTER — Encounter: Payer: Self-pay | Admitting: Family Medicine

## 2019-11-27 ENCOUNTER — Other Ambulatory Visit: Payer: Self-pay

## 2019-11-27 VITALS — BP 130/88 | HR 88 | Temp 98.5°F | Ht 72.0 in | Wt 206.0 lb

## 2019-11-27 DIAGNOSIS — R195 Other fecal abnormalities: Secondary | ICD-10-CM

## 2019-11-27 DIAGNOSIS — R197 Diarrhea, unspecified: Secondary | ICD-10-CM

## 2019-11-27 DIAGNOSIS — K921 Melena: Secondary | ICD-10-CM | POA: Diagnosis not present

## 2019-11-27 DIAGNOSIS — E876 Hypokalemia: Secondary | ICD-10-CM | POA: Insufficient documentation

## 2019-11-27 DIAGNOSIS — M19042 Primary osteoarthritis, left hand: Secondary | ICD-10-CM

## 2019-11-27 DIAGNOSIS — M19041 Primary osteoarthritis, right hand: Secondary | ICD-10-CM | POA: Diagnosis not present

## 2019-11-27 LAB — HEMOCCULT GUIAC POC 1CARD (OFFICE): Fecal Occult Blood, POC: POSITIVE — AB

## 2019-11-27 LAB — CBC WITH DIFFERENTIAL/PLATELET
Abs Immature Granulocytes: 0.01 10*3/uL (ref 0.00–0.07)
Basophils Absolute: 0 10*3/uL (ref 0.0–0.1)
Basophils Relative: 1 %
Eosinophils Absolute: 0.2 10*3/uL (ref 0.0–0.5)
Eosinophils Relative: 6 %
HCT: 38.5 % — ABNORMAL LOW (ref 39.0–52.0)
Hemoglobin: 13.4 g/dL (ref 13.0–17.0)
Immature Granulocytes: 0 %
Lymphocytes Relative: 24 %
Lymphs Abs: 0.8 10*3/uL (ref 0.7–4.0)
MCH: 30.5 pg (ref 26.0–34.0)
MCHC: 34.8 g/dL (ref 30.0–36.0)
MCV: 87.7 fL (ref 80.0–100.0)
Monocytes Absolute: 0.6 10*3/uL (ref 0.1–1.0)
Monocytes Relative: 17 %
Neutro Abs: 1.7 10*3/uL (ref 1.7–7.7)
Neutrophils Relative %: 52 %
Platelets: 216 10*3/uL (ref 150–400)
RBC: 4.39 MIL/uL (ref 4.22–5.81)
RDW: 13.5 % (ref 11.5–15.5)
WBC: 3.2 10*3/uL — ABNORMAL LOW (ref 4.0–10.5)
nRBC: 0 % (ref 0.0–0.2)

## 2019-11-27 LAB — BASIC METABOLIC PANEL
BUN: 9 (ref 4–21)
Creatinine: 0.9 (ref 0.6–1.3)
Glucose: 104
Potassium: 3.6 (ref 3.4–5.3)
Sodium: 140 (ref 137–147)

## 2019-11-27 LAB — RENAL FUNCTION PANEL
Albumin: 3.8 g/dL (ref 3.5–5.0)
Anion gap: 10 (ref 5–15)
BUN: 9 mg/dL (ref 8–23)
CO2: 23 mmol/L (ref 22–32)
Calcium: 8.4 mg/dL — ABNORMAL LOW (ref 8.9–10.3)
Chloride: 107 mmol/L (ref 98–111)
Creatinine, Ser: 0.85 mg/dL (ref 0.61–1.24)
GFR calc Af Amer: >60 mL/min (ref >60)
GFR calc non Af Amer: >60 mL/min (ref >60)
Glucose, Bld: 104 mg/dL — ABNORMAL HIGH (ref 70–99)
Phosphorus: 2.7 mg/dL (ref 2.5–4.6)
Potassium: 3.6 mmol/L (ref 3.5–5.1)
Sodium: 140 mmol/L (ref 135–145)

## 2019-11-27 LAB — CBC AND DIFFERENTIAL
Hemoglobin: 13.4 — AB (ref 13.5–17.5)
WBC: 3.2

## 2019-11-27 LAB — CBC: RBC: 4.39 (ref 3.87–5.11)

## 2019-11-27 NOTE — Progress Notes (Signed)
Date:  11/27/2019   Name:  Angel Cummings.   DOB:  [birth year]   MRN:  [id number]   Chief Complaint: Follow-up (diarrhea)  GI Problem The primary symptoms include diarrhea and melena. Primary symptoms do not include fever, weight loss, fatigue, abdominal pain, nausea, vomiting, hematemesis, jaundice, hematochezia, dysuria, myalgias, arthralgias or rash. The onset was gradual.  The illness does not include chills, anorexia, dysphagia, odynophagia, bloating, constipation, tenesmus, back pain or itching. Associated medical issues do not include inflammatory bowel disease, GERD, gallstones, liver disease, alcohol abuse, PUD, gastric bypass, bowel resection, irritable bowel syndrome, hemorrhoids or diverticulitis.    Lab Results  Component Value Date   CREATININE 1.31 (H) 11/24/2019   BUN 15 11/24/2019   NA 137 11/24/2019   K 2.9 (L) 11/24/2019   CL 105 11/24/2019   CO2 22 11/24/2019   Lab Results  Component Value Date   CHOL 204 (H) 11/05/2019   HDL 46 11/05/2019   LDLCALC 127 (H) 11/05/2019   TRIG 175 (H) 11/05/2019   CHOLHDL 5.0 06/25/2017   No results found for: TSH No results found for: HGBA1C Lab Results  Component Value Date   WBC 3.5 (L) 11/24/2019   HGB 14.2 11/24/2019   HCT 40.9 11/24/2019   MCV 87.2 11/24/2019   PLT 178 11/24/2019   No results found for: ALT, AST, GGT, ALKPHOS, BILITOT   Review of Systems  Constitutional: Negative for chills, fatigue, fever and weight loss.  HENT: Negative for drooling, ear discharge, ear pain and sore throat.   Respiratory: Negative for cough, shortness of breath and wheezing.   Cardiovascular: Negative for chest pain, palpitations and leg swelling.  Gastrointestinal: Positive for diarrhea and melena. Negative for abdominal pain, anorexia, bloating, blood in stool, constipation, dysphagia, hematemesis, hematochezia, jaundice, nausea and vomiting.  Endocrine: Negative for polydipsia.  Genitourinary: Negative for dysuria,  frequency, hematuria and urgency.  Musculoskeletal: Negative for arthralgias, back pain, myalgias and neck pain.  Skin: Negative for itching and rash.  Allergic/Immunologic: Negative for environmental allergies.  Neurological: Negative for dizziness and headaches.  Hematological: Does not bruise/bleed easily.  Psychiatric/Behavioral: Negative for suicidal ideas. The patient is not nervous/anxious.     Patient Active Problem List   Diagnosis Date Noted  . Cubital tunnel syndrome on right 07/18/2017  . Radiculopathy of cervical region 07/18/2017  . Acute postoperative pain 12/21/2016  . Open fracture of shaft of right ulna 12/20/2016  . Arthritis of both hands 04/17/2016  . Arthritis of hand 04/17/2016  . Essential hypertension 06/01/2015  . Hyperlipidemia 06/01/2015    Allergies  Allergen Reactions  . Etodolac   . Amoxicillin Rash    Past Surgical History:  Procedure Laterality Date  . COLONOSCOPY  2005   Dr Vickki Muff in [place] Left     Social History   Tobacco Use  . Smoking status: Never Smoker  . Smokeless tobacco: Never Used  Substance Use Topics  . Alcohol use: Yes    Alcohol/week: 0.0 standard drinks  . Drug use: No     Medication list has been reviewed and updated.  Current Meds  Medication Sig  . amLODipine (NORVASC) 10 MG tablet Take 1 tablet (10 mg total) by mouth daily.  Marland Kitchen aspirin 81 MG tablet Take 1 tablet (81 mg total) by mouth daily.  . clotrimazole-betamethasone (LOTRISONE) cream Apply 1 application topically 2 (two) times daily.  Marland Kitchen ibuprofen (ADVIL,MOTRIN) 200 MG tablet Take  1 tablet (200 mg total) by mouth every 6 (six) hours as needed.  . potassium chloride (KLOR-CON) 10 MEQ tablet Take 2 tablets (20 mEq total) by mouth daily.    PHQ 2/9 Scores 11/27/2019 11/24/2019 11/05/2019 09/16/2019  PHQ - 2 Score 0 1 0 4  PHQ- 9 Score 0 3 6 9     GAD 7 : Generalized Anxiety Score 11/27/2019 11/24/2019 11/05/2019 09/16/2019    Nervous, Anxious, on Edge 0 0 0 2  Control/stop worrying 0 0 0 2  Worry too much - different things 0 0 0 2  Trouble relaxing 0 0 2 0  Restless 0 0 0 0  Easily annoyed or irritable 0 0 0 0  Afraid - awful might happen 0 0 0 0  Total GAD 7 Score 0 0 2 6  Anxiety Difficulty - - Not difficult at all Not difficult at all    BP Readings from Last 3 Encounters:  11/27/19 130/88  11/24/19 (!) 124/52  11/05/19 140/80    Physical Exam Vitals and nursing note reviewed.  HENT:     Head: Normocephalic.     Right Ear: Tympanic membrane, ear canal and external ear normal. There is no impacted cerumen.     Left Ear: Tympanic membrane, ear canal and external ear normal.     Nose: Nose normal. No congestion or rhinorrhea.     Mouth/Throat:     Mouth: Mucous membranes are moist.  Eyes:     General: No scleral icterus.       Right eye: No discharge.        Left eye: No discharge.     Conjunctiva/sclera: Conjunctivae normal.     Pupils: Pupils are equal, round, and reactive to light.  Neck:     Thyroid: No thyromegaly.     Vascular: No JVD.     Trachea: No tracheal deviation.  Cardiovascular:     Rate and Rhythm: Normal rate and regular rhythm.     Heart sounds: Normal heart sounds. No murmur heard.  No friction rub. No gallop.   Pulmonary:     Effort: No respiratory distress.     Breath sounds: Normal breath sounds. No wheezing, rhonchi or rales.  Abdominal:     General: Bowel sounds are normal.     Palpations: Abdomen is soft. There is no mass.     Tenderness: There is no abdominal tenderness. There is no guarding or rebound.  Genitourinary:    Prostate: Normal. Not enlarged, not tender and no nodules present.     Rectum: Normal. Guaiac result positive. No mass, tenderness, anal fissure or external hemorrhoid.     Comments: Trace positive Musculoskeletal:        General: No tenderness. Normal range of motion.     Cervical back: Normal range of motion and neck supple.   Lymphadenopathy:     Cervical: No cervical adenopathy.  Skin:    General: Skin is warm.     Findings: No rash.  Neurological:     Mental Status: He is alert and oriented to person, place, and time.     Cranial Nerves: No cranial nerve deficit.     Deep Tendon Reflexes: Reflexes are normal and symmetric.     Wt Readings from Last 3 Encounters:  11/27/19 206 lb (93.4 kg)  11/24/19 206 lb (93.4 kg)  11/05/19 207 lb (93.9 kg)    BP 130/88   Pulse 88   Temp 98.5 F (36.9 C) (Oral)  Ht 6' (1.829 m)   Wt 206 lb (93.4 kg)   BMI 27.94 kg/m   Assessment and Plan: 1. Diarrhea, unspecified type New onset.  Currently resolved by patient's history.  Stable.  Patient is still awaiting stool cultures and C. difficile results from studies that were submitted yesterday to [facility].  Patient has noted 1 episode of a dark bowel movement that he described as watery and foul-smelling.  I am concerned for melena and we did need rectal exam.  2. Hypokalemia New onset.  Probably secondary to diarrhea.  Patient currently on supplementation and tolerating well.  We will obtain renal panel to assess electrolytes and will address accordingly.  3. Dark stools Patient had an episode for which he described dark bowel movements that were watery.  This is somewhat conflicting to his above history.  Patient is on ibuprofen for arthritis of the hands and we have suggested that he discontinue this.  We will obtain a CBC to evaluate his hemoglobin and proceed accordingly.  4. Arthritis of both hands Patient has a history of arthritis in both hands which he used to take Tylenol but is now taking ibuprofen.  Patient has been instructed to discontinue his ibuprofen for the time being and we will use Tylenol on a as needed basis until determination if there is any hemorrhagic gastritis.

## 2019-11-27 NOTE — Addendum Note (Signed)
Addended by: Juline Patch on: 11/27/2019 10:46 AM   Modules accepted: Orders

## 2019-12-01 LAB — CDIFF NAA+O+P+STOOL CULTURE
E coli, Shiga toxin Assay: NEGATIVE
Toxigenic C. Difficile by PCR: NEGATIVE

## 2019-12-02 ENCOUNTER — Other Ambulatory Visit: Payer: Self-pay

## 2019-12-11 ENCOUNTER — Encounter: Payer: Self-pay | Admitting: Gastroenterology

## 2019-12-11 ENCOUNTER — Other Ambulatory Visit: Payer: Self-pay

## 2019-12-16 ENCOUNTER — Other Ambulatory Visit: Payer: Self-pay

## 2019-12-16 ENCOUNTER — Other Ambulatory Visit
Admission: RE | Admit: 2019-12-16 | Discharge: 2019-12-16 | Disposition: A | Discharge status: Home / Self Care | Payer: Medicare HMO | Source: Ambulatory Visit | Attending: Gastroenterology | Admitting: Gastroenterology

## 2019-12-16 DIAGNOSIS — Z01812 Encounter for preprocedural laboratory examination: Secondary | ICD-10-CM | POA: Insufficient documentation

## 2019-12-16 DIAGNOSIS — Z20822 Contact with and (suspected) exposure to covid-19: Secondary | ICD-10-CM | POA: Insufficient documentation

## 2019-12-16 LAB — SARS CORONAVIRUS 2 (TAT 6-24 HRS): SARS Coronavirus 2: NEGATIVE

## 2019-12-17 NOTE — Discharge Instructions (Signed)
General Anesthesia, Adult, Care After This sheet gives you information about how to care for yourself after your procedure. Your health care provider may also give you more specific instructions. If you have problems or questions, contact your health care provider. What can I expect after the procedure? After the procedure, the following side effects are common:  Pain or discomfort at the IV site.  Nausea.  Vomiting.  Sore throat.  Trouble concentrating.  Feeling cold or chills.  Weak or tired.  Sleepiness and fatigue.  Soreness and body aches. These side effects can affect parts of the body that were not involved in surgery. Follow these instructions at home:  For at least 24 hours after the procedure:  Have a responsible adult stay with you. It is important to have someone help care for you until you are awake and alert.  Rest as needed.  Do not: ? Participate in activities in which you could fall or become injured. ? Drive. ? Use heavy machinery. ? Drink alcohol. ? Take sleeping pills or medicines that cause drowsiness. ? Make important decisions or sign legal documents. ? Take care of children on your own. Eating and drinking  Follow any instructions from your health care provider about eating or drinking restrictions.  When you feel hungry, start by eating small amounts of foods that are soft and easy to digest (bland), such as toast. Gradually return to your regular diet.  Drink enough fluid to keep your urine pale yellow.  If you vomit, rehydrate by drinking water, juice, or clear broth. General instructions  If you have sleep apnea, surgery and certain medicines can increase your risk for breathing problems. Follow instructions from your health care provider about wearing your sleep device: ? Anytime you are sleeping, including during daytime naps. ? While taking prescription pain medicines, sleeping medicines, or medicines that make you drowsy.  Return to  your normal activities as told by your health care provider. Ask your health care provider what activities are safe for you.  Take over-the-counter and prescription medicines only as told by your health care provider.  If you smoke, do not smoke without supervision.  Keep all follow-up visits as told by your health care provider. This is important. Contact a health care provider if:  You have nausea or vomiting that does not get better with medicine.  You cannot eat or drink without vomiting.  You have pain that does not get better with medicine.  You are unable to pass urine.  You develop a skin rash.  You have a fever.  You have redness around your IV site that gets worse. Get help right away if:  You have difficulty breathing.  You have chest pain.  You have blood in your urine or stool, or you vomit blood. Summary  After the procedure, it is common to have a sore throat or nausea. It is also common to feel tired.  Have a responsible adult stay with you for the first 24 hours after general anesthesia. It is important to have someone help care for you until you are awake and alert.  When you feel hungry, start by eating small amounts of foods that are soft and easy to digest (bland), such as toast. Gradually return to your regular diet.  Drink enough fluid to keep your urine pale yellow.  Return to your normal activities as told by your health care provider. Ask your health care provider what activities are safe for you. This information is not   intended to replace advice given to you by your health care provider. Make sure you discuss any questions you have with your health care provider. Document Revised: 04/27/2017 Document Reviewed: 12/08/2016 Elsevier Patient Education  2020 Elsevier Inc.  

## 2019-12-18 ENCOUNTER — Ambulatory Visit
Admission: RE | Admit: 2019-12-18 | Discharge: 2019-12-18 | Disposition: A | Discharge status: Home / Self Care | Payer: Medicare HMO | Attending: Gastroenterology | Admitting: Gastroenterology

## 2019-12-18 ENCOUNTER — Ambulatory Visit: Payer: Medicare HMO | Admitting: Anesthesiology

## 2019-12-18 ENCOUNTER — Encounter
Admission: RE | Disposition: A | Discharge status: Home / Self Care | Payer: Self-pay | Source: Home / Self Care | Attending: Gastroenterology

## 2019-12-18 ENCOUNTER — Other Ambulatory Visit: Payer: Self-pay

## 2019-12-18 ENCOUNTER — Encounter: Payer: Self-pay | Admitting: Gastroenterology

## 2019-12-18 DIAGNOSIS — Z1211 Encounter for screening for malignant neoplasm of colon: Secondary | ICD-10-CM | POA: Insufficient documentation

## 2019-12-18 DIAGNOSIS — K219 Gastro-esophageal reflux disease without esophagitis: Secondary | ICD-10-CM | POA: Insufficient documentation

## 2019-12-18 DIAGNOSIS — E785 Hyperlipidemia, unspecified: Secondary | ICD-10-CM | POA: Diagnosis not present

## 2019-12-18 DIAGNOSIS — Z79899 Other long term (current) drug therapy: Secondary | ICD-10-CM | POA: Insufficient documentation

## 2019-12-18 DIAGNOSIS — I10 Essential (primary) hypertension: Secondary | ICD-10-CM | POA: Insufficient documentation

## 2019-12-18 DIAGNOSIS — K5730 Diverticulosis of large intestine without perforation or abscess without bleeding: Secondary | ICD-10-CM | POA: Diagnosis not present

## 2019-12-18 DIAGNOSIS — M1990 Unspecified osteoarthritis, unspecified site: Secondary | ICD-10-CM | POA: Diagnosis not present

## 2019-12-18 DIAGNOSIS — K648 Other hemorrhoids: Secondary | ICD-10-CM | POA: Diagnosis not present

## 2019-12-18 HISTORY — PX: COLONOSCOPY WITH PROPOFOL: SHX5780

## 2019-12-18 HISTORY — DX: Gastro-esophageal reflux disease without esophagitis: K21.9

## 2019-12-18 HISTORY — DX: Presence of external hearing-aid: Z97.4

## 2019-12-18 HISTORY — DX: Carpal tunnel syndrome, bilateral upper limbs: G56.03

## 2019-12-18 SURGERY — COLONOSCOPY WITH PROPOFOL
Anesthesia: General | Site: Rectum

## 2019-12-18 MED ORDER — ACETAMINOPHEN 160 MG/5ML PO SOLN: 325.0000 mg | ORAL | Status: DC | PRN

## 2019-12-18 MED ORDER — LACTATED RINGERS IV SOLN: INTRAVENOUS | Status: DC

## 2019-12-18 MED ORDER — PROPOFOL 10 MG/ML IV BOLUS
INTRAVENOUS | Status: DC | PRN
  Administered 2019-12-18: 20 mg via INTRAVENOUS
  Administered 2019-12-18: 40 mg via INTRAVENOUS
  Administered 2019-12-18 (×5): 20 mg via INTRAVENOUS
  Administered 2019-12-18: 100 mg via INTRAVENOUS
  Administered 2019-12-18: 40 mg via INTRAVENOUS

## 2019-12-18 MED ORDER — SODIUM CHLORIDE 0.9 % IV SOLN: INTRAVENOUS | Status: DC

## 2019-12-18 MED ORDER — ACETAMINOPHEN 325 MG PO TABS: 325.0000 mg | ORAL_TABLET | ORAL | Status: DC | PRN

## 2019-12-18 MED ORDER — LIDOCAINE HCL (CARDIAC) PF 100 MG/5ML IV SOSY
PREFILLED_SYRINGE | INTRAVENOUS | Status: DC | PRN
  Administered 2019-12-18: 30 mg via INTRAVENOUS

## 2019-12-18 MED ORDER — ONDANSETRON HCL 4 MG/2ML IJ SOLN: 4.0000 mg | Freq: Once | INTRAMUSCULAR | Status: DC | PRN

## 2019-12-18 MED ORDER — STERILE WATER FOR IRRIGATION IR SOLN
Status: DC | PRN
  Administered 2019-12-18: .05 mL

## 2019-12-18 SURGICAL SUPPLY — 5 items
GOWN CVR UNV OPN BCK APRN NK (MISCELLANEOUS) ×2 IMPLANT
GOWN ISOL THUMB LOOP REG UNIV (MISCELLANEOUS) ×4
KIT ENDO PROCEDURE OLY (KITS) ×2 IMPLANT
MANIFOLD NEPTUNE II (INSTRUMENTS) ×2 IMPLANT
WATER STERILE IRR 250ML POUR (IV SOLUTION) ×2 IMPLANT

## 2019-12-18 NOTE — H&P (Signed)
Lucilla Lame, MD [address] [place], [place] [address] Phone: [phone] Fax : [phone]  Primary Care Physician:  Juline Patch, MD Primary Gastroenterologist:  Dr. Allen Norris  Pre-Procedure History & Physical: HPI:  Angel Cummings. is a 74 y.o. male is here for a screening colonoscopy.   Past Medical History:  Diagnosis Date  . Arthritis   . Carpal tunnel syndrome on both sides   . GERD (gastroesophageal reflux disease)   . Hyperlipidemia   . Hypertension   . Wears hearing aid in both ears    Has.  Does not wear    Past Surgical History:  Procedure Laterality Date  . COLONOSCOPY  2005   Dr Vickki Muff in [place] Left     Prior to Admission medications   Medication Sig Start Date End Date Taking? Authorizing Provider  acetaminophen (ACETAMINOPHEN 8 HOUR) 650 MG CR tablet Take 1,300 mg by mouth every 8 (eight) hours as needed for pain.   Yes [provider]  amLODipine (NORVASC) 10 MG tablet Take 1 tablet (10 mg total) by mouth daily. 09/16/19  Yes Juline Patch, MD  aspirin 81 MG tablet Take 1 tablet (81 mg total) by mouth daily. 06/01/15  Yes Juline Patch, MD  atorvastatin (LIPITOR) 10 MG tablet Take 1 tablet (10 mg total) by mouth daily. 09/16/19  Yes Juline Patch, MD  Ca Carbonate-Mag Hydroxide (ROLAIDS PO) Take by mouth as needed.   Yes [provider]  clotrimazole-betamethasone (LOTRISONE) cream Apply 1 application topically 2 (two) times daily. 01/03/19  Yes Juline Patch, MD  Cyanocobalamin (VITAMIN B-12 PO) Take by mouth daily.   Yes [provider]  fluticasone (FLONASE) 50 MCG/ACT nasal spray Place into both nostrils daily.   Yes [provider]  potassium chloride (KLOR-CON) 10 MEQ tablet Take 2 tablets (20 mEq total) by mouth daily. 11/24/19  Yes Juline Patch, MD  sertraline (ZOLOFT) 25 MG tablet Take 1 tablet (25 mg total) by mouth daily. Patient not taking: Reported  on 11/24/2019 11/05/19   Juline Patch, MD    Allergies as of 11/12/2019 - Review Complete 11/05/2019  Allergen Reaction Noted  . Etodolac  06/01/2015  . Amoxicillin Rash 01/03/2019    History reviewed. No pertinent family history.  Social History   Socioeconomic History  . Marital status: Single    Spouse name: Not on file  . Number of children: Not on file  . Years of education: Not on file  . Highest education level: Not on file  Occupational History  . Not on file  Tobacco Use  . Smoking status: Never Smoker  . Smokeless tobacco: Never Used  Vaping Use  . Vaping Use: Never used  Substance and Sexual Activity  . Alcohol use: Yes    Alcohol/week: 0.0 standard drinks    Comment: Rare  . Drug use: No  . Sexual activity: Not Currently  Other Topics Concern  . Not on file  Social History Narrative  . Not on file   Social Determinants of Health   Financial Resource Strain:   . Difficulty of Paying Living Expenses:   Food Insecurity:   . Worried About Charity fundraiser in the Last Year:   . Arboriculturist in the Last Year:   Transportation Needs:   . Film/video editor (Medical):   Marland Kitchen Lack of Transportation (Non-Medical):   Physical  Activity:   . Days of Exercise per Week:   . Minutes of Exercise per Session:   Stress:   . Feeling of Stress :   Social Connections:   . Frequency of Communication with Friends and Family:   . Frequency of Social Gatherings with Friends and Family:   . Attends Religious Services:   . Active Member of Clubs or Organizations:   . Attends Archivist Meetings:   Marland Kitchen Marital Status:   Intimate Partner Violence:   . Fear of Current or Ex-Partner:   . Emotionally Abused:   Marland Kitchen Physically Abused:   . Sexually Abused:     Review of Systems: See HPI, otherwise negative ROS  Physical Exam: BP (!) 171/77   Pulse 77   Temp 98.1 F (36.7 C) (Temporal)   Resp 16   Ht 6' (1.829 m)   Wt 92.5 kg   SpO2 97%   BMI 27.67  kg/m  General:   Alert,  pleasant and cooperative in NAD Head:  Normocephalic and atraumatic. Neck:  Supple; no masses or thyromegaly. Lungs:  Clear throughout to auscultation.    Heart:  Regular rate and rhythm. Abdomen:  Soft, nontender and nondistended. Normal bowel sounds, without guarding, and without rebound.   Neurologic:  Alert and  oriented x4;  grossly normal neurologically.  Impression/Plan: Angel [place]. is now here to undergo a screening colonoscopy.  Risks, benefits, and alternatives regarding colonoscopy have been reviewed with the patient.  Questions have been answered.  All parties agreeable.

## 2019-12-18 NOTE — Anesthesia Preprocedure Evaluation (Signed)
Anesthesia Evaluation  Patient identified by MRN, date of birth, ID band Patient awake    Reviewed: Allergy & Precautions, NPO status , Patient's Chart, lab work & pertinent test results  Airway Mallampati: II  TM Distance: >3 FB Neck ROM: Full    Dental no notable dental hx.    Pulmonary neg pulmonary ROS,    Pulmonary exam normal breath sounds clear to auscultation       Cardiovascular Exercise Tolerance: Good hypertension, negative cardio ROS Normal cardiovascular exam Rhythm:Regular Rate:Normal     Neuro/Psych negative neurological ROS  negative psych ROS   GI/Hepatic Neg liver ROS, GERD  ,  Endo/Other  negative endocrine ROS  Renal/GU negative Renal ROS  negative genitourinary   Musculoskeletal  (+) Arthritis ,   Abdominal Normal abdominal exam  (+) - obese,   Peds negative pediatric ROS (+)  Hematology negative hematology ROS (+)   Anesthesia Other Findings   Reproductive/Obstetrics negative OB ROS                             Anesthesia Physical Anesthesia Plan  ASA: III  Anesthesia Plan: General   Post-op Pain Management:    Induction: Intravenous  PONV Risk Score and Plan: 1 and Propofol infusion and Treatment may vary due to age or medical condition  Airway Management Planned: Simple Face Mask  Additional Equipment:   Intra-op Plan:   Post-operative Plan:   Informed Consent: I have reviewed the patients History and Physical, chart, labs and discussed the procedure including the risks, benefits and alternatives for the proposed anesthesia with the patient or authorized representative who has indicated his/her understanding and acceptance.     Dental advisory given  Plan Discussed with: Anesthesiologist and CRNA  Anesthesia Plan Comments:         Anesthesia Quick Evaluation  Patient Active Problem List   Diagnosis Date Noted  . Cubital tunnel  syndrome on right 07/18/2017  . Radiculopathy of cervical region 07/18/2017  . Acute postoperative pain 12/21/2016  . Open fracture of shaft of right ulna 12/20/2016  . Arthritis of both hands 04/17/2016  . Arthritis of hand 04/17/2016  . Essential hypertension 06/01/2015  . Hyperlipidemia 06/01/2015    CBC Latest Ref Rng & Units 11/27/2019 11/27/2019 11/24/2019  WBC 4.0 - 10.5 K/uL 3.2(L) 3.2 3.5(L)  Hemoglobin 13.0 - 17.0 g/dL 13.4 13.4(A) 14.2  Hematocrit 39 - 52 % 38.5(L) - 40.9  Platelets 150 - 400 K/uL 216 - 178   BMP Latest Ref Rng & Units 11/27/2019 11/27/2019 11/24/2019  Glucose 70 - 99 mg/dL 104(H) - 110(H)  BUN 8 - 23 mg/dL 9 9 15   Creatinine 0.61 - 1.24 mg/dL 0.85 0.9 1.31(H)  BUN/Creat Ratio 10 - 24 - - -  Sodium 135 - 145 mmol/L 140 140 137  Potassium 3.5 - 5.1 mmol/L 3.6 3.6 2.9(L)  Chloride 98 - 111 mmol/L 107 - 105  CO2 22 - 32 mmol/L 23 - 22  Calcium 8.9 - 10.3 mg/dL 8.4(L) - 8.7(L)    Risks and benefits of anesthesia discussed at length, patient or surrogate demonstrates understanding. Appropriately NPO. Plan to proceed with anesthesia.  Champ Mungo, MD 12/18/19

## 2019-12-18 NOTE — Op Note (Signed)
[facility] Patient Name: Angel Cummings Procedure Date: 12/18/2019 8:26 AM MRN: [id number] Account #: [id number] Date of Birth: Feb 06, 1946 Admit Type: Outpatient Age: 74 Room: [facility] OR ROOM 01 Gender: Male Note Status: Finalized Procedure:             Colonoscopy Indications:           Screening for colorectal malignant neoplasm Providers:             Lucilla Lame MD, MD Referring MD:          Juline Patch, MD (Referring MD) Medicines:             Propofol per Anesthesia Complications:         No immediate complications. Procedure:             Pre-Anesthesia Assessment:                        - Prior to the procedure, a History and Physical was                         performed, and patient medications and allergies were                         reviewed. The patient's tolerance of previous                         anesthesia was also reviewed. The risks and benefits                         of the procedure and the sedation options and risks                         were discussed with the patient. All questions were                         answered, and informed consent was obtained. Prior                         Anticoagulants: The patient has taken no previous                         anticoagulant or antiplatelet agents. ASA Grade                         Assessment: II - A patient with mild systemic disease.                         After reviewing the risks and benefits, the patient                         was deemed in satisfactory condition to undergo the                         procedure.                        After obtaining informed consent, the colonoscope was  passed under direct vision. Throughout the procedure,                         the patient's blood pressure, pulse, and oxygen                         saturations were monitored continuously. The                         Colonoscope was introduced through the anus  and                         advanced to the the cecum, identified by appendiceal                         orifice and ileocecal valve. The colonoscopy was                         performed without difficulty. The patient tolerated                         the procedure well. The quality of the bowel                         preparation was excellent. Findings:      The perianal and digital rectal examinations were normal.      Multiple small-mouthed diverticula were found in the sigmoid colon.      Non-bleeding internal hemorrhoids were found during retroflexion. The       hemorrhoids were Grade I (internal hemorrhoids that do not prolapse). Impression:            - Diverticulosis in the sigmoid colon.                        - Non-bleeding internal hemorrhoids.                        - No specimens collected. Recommendation:        - Discharge patient to home.                        - Resume previous diet.                        - Continue present medications. Procedure Code(s):     --- Professional ---                        [id number], Colonoscopy, flexible; diagnostic, including                         collection of specimen(s) by brushing or washing, when                         performed (separate procedure) Diagnosis Code(s):     --- Professional ---                        Z12.11, Encounter for screening for malignant neoplasm  of colon CPT copyright 2019 American Medical Association. All rights reserved. The codes documented in this report are preliminary and upon coder review may  be revised to meet current compliance requirements. Lucilla Lame MD, MD 12/18/2019 9:02:04 AM This report has been signed electronically. Number of Addenda: 0 Note Initiated On: 12/18/2019 8:26 AM Scope Withdrawal Time: 0 hours 9 minutes 23 seconds  Total Procedure Duration: 0 hours 22 minutes 10 seconds  Estimated Blood Loss:  Estimated blood loss: none.      [facility]

## 2019-12-18 NOTE — Anesthesia Procedure Notes (Signed)
Date/Time: 12/18/2019 8:33 AM Performed by: Cameron Ali, CRNA Pre-anesthesia Checklist: Patient identified, Emergency Drugs available, Suction available, Timeout performed and Patient being monitored Patient Re-evaluated:Patient Re-evaluated prior to induction Oxygen Delivery Method: Nasal cannula Placement Confirmation: positive ETCO2

## 2019-12-18 NOTE — Transfer of Care (Signed)
Immediate Anesthesia Transfer of Care Note  Patient: Angel Cummings.  Procedure(s) Performed: COLONOSCOPY WITH PROPOFOL (N/A Rectum)  Patient Location: PACU  Anesthesia Type: General  Level of Consciousness: awake, alert  and patient cooperative  Airway and Oxygen Therapy: Patient Spontanous Breathing and Patient connected to supplemental oxygen  Post-op Assessment: Post-op Vital signs reviewed, Patient's Cardiovascular Status Stable, Respiratory Function Stable, Patent Airway and No signs of Nausea or vomiting  Post-op Vital Signs: Reviewed and stable  Complications: No complications documented.

## 2019-12-18 NOTE — Anesthesia Postprocedure Evaluation (Signed)
Anesthesia Post Note  Patient: Angel Cummings.  Procedure(s) Performed: COLONOSCOPY WITH PROPOFOL (N/A Rectum)     Patient location during evaluation: Phase II Anesthesia Type: General Level of consciousness: awake and alert and oriented Pain management: pain level controlled Vital Signs Assessment: post-procedure vital signs reviewed and stable Respiratory status: nonlabored ventilation, respiratory function stable and spontaneous breathing Cardiovascular status: blood pressure returned to baseline and stable Postop Assessment: no headache and no backache Anesthetic complications: no   No complications documented.  Sinda Du

## 2019-12-22 ENCOUNTER — Encounter: Payer: Self-pay | Admitting: Gastroenterology

## 2020-01-09 ENCOUNTER — Telehealth: Payer: Self-pay | Admitting: Family Medicine

## 2020-01-09 DIAGNOSIS — B356 Tinea cruris: Secondary | ICD-10-CM

## 2020-01-09 NOTE — Telephone Encounter (Signed)
Patient is calling to check on the status of his medication refill. Advised that medication refills can take up to 3 business days. Patient expressed dissatisfaction in the system. Please advise CB- [phone]

## 2020-01-09 NOTE — Telephone Encounter (Signed)
Requested medication (s) are due for refill today: Yes  Requested medication (s) are on the active medication list: Yes  Last refill:  01/03/19  Future visit scheduled: Yes  Notes to clinic:  Prescription has expired.    Requested Prescriptions  Pending Prescriptions Disp Refills   clotrimazole-betamethasone (LOTRISONE) cream 30 g 1    Sig: Apply 1 application topically 2 (two) times daily.      Off-Protocol Failed - 01/09/2020  8:48 AM      Failed - Medication not assigned to a protocol, review manually.      Passed - Valid encounter within last 12 months    Recent Outpatient Visits           1 month ago Diarrhea, unspecified type   [place], MD   1 month ago Diarrhea, unspecified type   [facility] Juline Patch, MD   2 months ago Adjustment reaction with anxiety and depression   [facility] Juline Patch, MD   3 months ago Essential hypertension   [place], Deanna C, MD   1 year ago Mixed hyperlipidemia   [facility] Juline Patch, MD       Future Appointments             In 3 months Juline Patch, MD [facility], [facility]

## 2020-01-09 NOTE — Telephone Encounter (Signed)
I called [facility] where pt is suppose to be going- they have been trying to reach him for the medication and follow up appt- he needs to get back in touch with them for this medication.

## 2020-01-09 NOTE — Telephone Encounter (Signed)
Medication Refill - Medication: Lotrisone   Has the patient contacted their pharmacy? Yes.   Pt is requesting to have this sent in today if possible due to the long weekend. Please advise.  (Agent: If no, request that the patient contact the pharmacy for the refill.) (Agent: If yes, when and what did the pharmacy advise?)  Preferred Pharmacy (with phone number or street name):  [facility] [address] [place], [place]  [place] [address]  Phone: [phone] Fax: [phone]  Hours: Not open 24 hours     Agent: Please be advised that RX refills may take up to 3 business days. We ask that you follow-up with your pharmacy.

## 2020-01-14 ENCOUNTER — Telehealth: Payer: Self-pay | Admitting: Family Medicine

## 2020-01-14 ENCOUNTER — Other Ambulatory Visit: Payer: Self-pay | Admitting: Family Medicine

## 2020-01-14 DIAGNOSIS — B356 Tinea cruris: Secondary | ICD-10-CM

## 2020-01-14 NOTE — Telephone Encounter (Unsigned)
Copied from [place] [id number]. Topic: General - Other >> Jan 14, 2020 12:24 PM Rainey Pines A wrote: Patient is requesting a callback from Angel Cummings and stated that he did not wish to go into detail but stated that only she can help him. Please advise

## 2020-01-15 ENCOUNTER — Other Ambulatory Visit: Payer: Self-pay

## 2020-01-15 DIAGNOSIS — B356 Tinea cruris: Secondary | ICD-10-CM

## 2020-01-15 MED ORDER — CLOTRIMAZOLE-BETAMETHASONE 1-0.05 % EX CREA: 1.0000 "application " | TOPICAL_CREAM | Freq: Two times a day (BID) | CUTANEOUS | 0 refills | Status: DC

## 2020-01-15 NOTE — Telephone Encounter (Signed)
Sent in cream for pt- also told him he needs to get back in with dermatology, they have been trying to reach him

## 2020-01-21 DIAGNOSIS — R21 Rash and other nonspecific skin eruption: Secondary | ICD-10-CM | POA: Diagnosis not present

## 2020-02-11 ENCOUNTER — Ambulatory Visit (INDEPENDENT_AMBULATORY_CARE_PROVIDER_SITE_OTHER): Payer: Medicare HMO

## 2020-02-11 DIAGNOSIS — Z0000 Encounter for general adult medical examination without abnormal findings: Secondary | ICD-10-CM

## 2020-02-11 NOTE — Patient Instructions (Signed)
[name] , Thank you for taking time to come for your Medicare Wellness Visit. I appreciate your ongoing commitment to your health goals. Please review the following plan we discussed and let me know if I can assist you in the future.   Screening recommendations/referrals: Colonoscopy: done 12/18/19 Recommended yearly ophthalmology/optometry visit for glaucoma screening and checkup Recommended yearly dental visit for hygiene and checkup  Vaccinations: Influenza vaccine: declined  Pneumococcal vaccine: done 05/17/17 Tdap vaccine: done 12/19/16 Shingles vaccine: Shingrix discussed. Please contact your pharmacy for coverage information.  Covid-19: declined  Advanced directives: Advance directive discussed with you today. Even though you declined this today please call our office should you change your mind and we can give you the proper paperwork for you to fill out.  Conditions/risks identified: Recommend drinking 6-8 glasses of water per day.   Next appointment: Follow up in one year for your annual wellness visit.   Preventive Care 22 Years and Older, Male Preventive care refers to lifestyle choices and visits with your health care provider that can promote health and wellness. What does preventive care include?  A yearly physical exam. This is also called an annual well check.  Dental exams once or twice a year.  Routine eye exams. Ask your health care provider how often you should have your eyes checked.  Personal lifestyle choices, including:  Daily care of your teeth and gums.  Regular physical activity.  Eating a healthy diet.  Avoiding tobacco and drug use.  Limiting alcohol use.  Practicing safe sex.  Taking low doses of aspirin every day.  Taking vitamin and mineral supplements as recommended by your health care provider. What happens during an annual well check? The services and screenings done by your health care provider during your annual well check will  depend on your age, overall health, lifestyle risk factors, and family history of disease. Counseling  Your health care provider may ask you questions about your:  Alcohol use.  Tobacco use.  Drug use.  Emotional well-being.  Home and relationship well-being.  Sexual activity.  Eating habits.  History of falls.  Memory and ability to understand (cognition).  Work and work Statistician. Screening  You may have the following tests or measurements:  Height, weight, and BMI.  Blood pressure.  Lipid and cholesterol levels. These may be checked every 5 years, or more frequently if you are over 85 years old.  Skin check.  Lung cancer screening. You may have this screening every year starting at age 29 if you have a 30-pack-year history of smoking and currently smoke or have quit within the past 15 years.  Fecal occult blood test (FOBT) of the stool. You may have this test every year starting at age 65.  Flexible sigmoidoscopy or colonoscopy. You may have a sigmoidoscopy every 5 years or a colonoscopy every 10 years starting at age 30.  Prostate cancer screening. Recommendations will vary depending on your family history and other risks.  Hepatitis C blood test.  Hepatitis B blood test.  Sexually transmitted disease (STD) testing.  Diabetes screening. This is done by checking your blood sugar (glucose) after you have not eaten for a while (fasting). You may have this done every 1-3 years.  Abdominal aortic aneurysm (AAA) screening. You may need this if you are a current or former smoker.  Osteoporosis. You may be screened starting at age 28 if you are at high risk. Talk with your health care provider about your test results, treatment options,  and if necessary, the need for more tests. Vaccines  Your health care provider may recommend certain vaccines, such as:  Influenza vaccine. This is recommended every year.  Tetanus, diphtheria, and acellular pertussis (Tdap,  Td) vaccine. You may need a Td booster every 10 years.  Zoster vaccine. You may need this after age 8.  Pneumococcal 13-valent conjugate (PCV13) vaccine. One dose is recommended after age 22.  Pneumococcal polysaccharide (PPSV23) vaccine. One dose is recommended after age 57. Talk to your health care provider about which screenings and vaccines you need and how often you need them. This information is not intended to replace advice given to you by your health care provider. Make sure you discuss any questions you have with your health care provider. Document Released: 05/21/2015 Document Revised: 01/12/2016 Document Reviewed: 02/23/2015 Elsevier Interactive Patient Education  2017 [place] Prevention in the Home Falls can cause injuries. They can happen to people of all ages. There are many things you can do to make your home safe and to help prevent falls. What can I do on the outside of my home?  Regularly fix the edges of walkways and driveways and fix any cracks.  Remove anything that might make you trip as you walk through a door, such as a raised step or threshold.  Trim any bushes or trees on the path to your home.  Use bright outdoor lighting.  Clear any walking paths of anything that might make someone trip, such as rocks or tools.  Regularly check to see if handrails are loose or broken. Make sure that both sides of any steps have handrails.  Any raised decks and porches should have guardrails on the edges.  Have any leaves, snow, or ice cleared regularly.  Use sand or salt on walking paths during winter.  Clean up any spills in your garage right away. This includes oil or grease spills. What can I do in the bathroom?  Use night lights.  Install grab bars by the toilet and in the tub and shower. Do not use towel bars as grab bars.  Use non-skid mats or decals in the tub or shower.  If you need to sit down in the shower, use a plastic, non-slip  stool.  Keep the floor dry. Clean up any water that spills on the floor as soon as it happens.  Remove soap buildup in the tub or shower regularly.  Attach bath mats securely with double-sided non-slip rug tape.  Do not have throw rugs and other things on the floor that can make you trip. What can I do in the bedroom?  Use night lights.  Make sure that you have a light by your bed that is easy to reach.  Do not use any sheets or blankets that are too big for your bed. They should not hang down onto the floor.  Have a firm chair that has side arms. You can use this for support while you get dressed.  Do not have throw rugs and other things on the floor that can make you trip. What can I do in the kitchen?  Clean up any spills right away.  Avoid walking on wet floors.  Keep items that you use a lot in easy-to-reach places.  If you need to reach something above you, use a strong step stool that has a grab bar.  Keep electrical cords out of the way.  Do not use floor polish or wax that makes floors slippery. If  you must use wax, use non-skid floor wax.  Do not have throw rugs and other things on the floor that can make you trip. What can I do with my stairs?  Do not leave any items on the stairs.  Make sure that there are handrails on both sides of the stairs and use them. Fix handrails that are broken or loose. Make sure that handrails are as long as the stairways.  Check any carpeting to make sure that it is firmly attached to the stairs. Fix any carpet that is loose or worn.  Avoid having throw rugs at the top or bottom of the stairs. If you do have throw rugs, attach them to the floor with carpet tape.  Make sure that you have a light switch at the top of the stairs and the bottom of the stairs. If you do not have them, ask someone to add them for you. What else can I do to help prevent falls?  Wear shoes that:  Do not have high heels.  Have rubber bottoms.  Are  comfortable and fit you well.  Are closed at the toe. Do not wear sandals.  If you use a stepladder:  Make sure that it is fully opened. Do not climb a closed stepladder.  Make sure that both sides of the stepladder are locked into place.  Ask someone to hold it for you, if possible.  Clearly mark and make sure that you can see:  Any grab bars or handrails.  First and last steps.  Where the edge of each step is.  Use tools that help you move around (mobility aids) if they are needed. These include:  Canes.  Walkers.  Scooters.  Crutches.  Turn on the lights when you go into a dark area. Replace any light bulbs as soon as they burn out.  Set up your furniture so you have a clear path. Avoid moving your furniture around.  If any of your floors are uneven, fix them.  If there are any pets around you, be aware of where they are.  Review your medicines with your doctor. Some medicines can make you feel dizzy. This can increase your chance of falling. Ask your doctor what other things that you can do to help prevent falls. This information is not intended to replace advice given to you by your health care provider. Make sure you discuss any questions you have with your health care provider. Document Released: 02/18/2009 Document Revised: 09/30/2015 Document Reviewed: 05/29/2014 Elsevier Interactive Patient Education  2017 [employer].

## 2020-02-11 NOTE — Progress Notes (Addendum)
Subjective:   Angel Cummings. is a 74 y.o. male who presents for an Initial Medicare Annual Wellness Visit.  Virtual Visit via Telephone Note  I connected with  Colon Branch. on 02/11/20 at  8:00 AM EDT by telephone and verified that I am speaking with the correct person using two identifiers.  Medicare Annual Wellness visit completed telephonically due to Covid-19 pandemic.   Location: Patient: home Provider: [facility]   I discussed the limitations, risks, security and privacy concerns of performing an evaluation and management service by telephone and the availability of in person appointments. The patient expressed understanding and agreed to proceed.  Unable to perform video visit due to video visit attempted and failed and/or patient does not have video capability.   Some vital signs may be absent or patient reported.   Clemetine Marker, LPN    Review of Systems     Cardiac Risk Factors include: advanced age (>32men, >68 women);dyslipidemia;male gender;hypertension     Objective:    There were no vitals filed for this visit. There is no height or weight on file to calculate BMI.  Advanced Directives 02/11/2020 12/18/2019  Does Patient Have a Medical Advance Directive? No No  Would patient like information on creating a medical advance directive? No - Patient declined No - Patient declined    Current Medications (verified) Outpatient Encounter Medications as of 02/11/2020  Medication Sig  . acetaminophen (ACETAMINOPHEN 8 HOUR) 650 MG CR tablet Take 1,300 mg by mouth every 8 (eight) hours as needed for pain.  Marland Kitchen amLODipine (NORVASC) 10 MG tablet Take 1 tablet (10 mg total) by mouth daily.  Marland Kitchen aspirin 81 MG tablet Take 1 tablet (81 mg total) by mouth daily.  Marland Kitchen atorvastatin (LIPITOR) 10 MG tablet Take 1 tablet (10 mg total) by mouth daily.  . Ca Carbonate-Mag Hydroxide (ROLAIDS PO) Take by mouth as needed.  . Cyanocobalamin (VITAMIN B-12 PO) Take by mouth daily.  . fluticasone  (FLONASE) 50 MCG/ACT nasal spray Place into both nostrils daily.  . potassium chloride (KLOR-CON) 10 MEQ tablet Take 2 tablets (20 mEq total) by mouth daily. (Patient not taking: Reported on 02/11/2020)  . [DISCONTINUED] clotrimazole-betamethasone (LOTRISONE) cream Apply 1 application topically 2 (two) times daily.  . [DISCONTINUED] sertraline (ZOLOFT) 25 MG tablet Take 1 tablet (25 mg total) by mouth daily. (Patient not taking: Reported on 11/24/2019)   No facility-administered encounter medications on file as of 02/11/2020.    Allergies (verified) Amoxicillin and Etodolac   History: Past Medical History:  Diagnosis Date  . Arthritis   . Carpal tunnel syndrome on both sides   . GERD (gastroesophageal reflux disease)   . Hyperlipidemia   . Hypertension   . Wears hearing aid in both ears    Has.  Does not wear   Past Surgical History:  Procedure Laterality Date  . COLONOSCOPY  2005   [facility] Vickki Muff in [facility]  . COLONOSCOPY WITH PROPOFOL N/A 12/18/2019   Procedure: COLONOSCOPY WITH PROPOFOL;  Surgeon: Lucilla Lame, MD;  Location: [place];  Service: Endoscopy;  Laterality: N/A;  . HERNIA REPAIR    . KNEE SURGERY Left    History reviewed. No pertinent family history. Social History   Socioeconomic History  . Marital status: Significant Other    Spouse name: Not on file  . Number of children: 1  . Years of education: Not on file  . Highest education level: Not on file  Occupational History  . Not on  file  Tobacco Use  . Smoking status: Never Smoker  . Smokeless tobacco: Never Used  Vaping Use  . Vaping Use: Never used  Substance and Sexual Activity  . Alcohol use: Yes    Alcohol/week: 0.0 standard drinks    Comment: Rare  . Drug use: No  . Sexual activity: Not Currently  Other Topics Concern  . Not on file  Social History Narrative   Lives with significant other   Social Determinants of Health   Financial Resource Strain: Low Risk   . Difficulty of Paying  Living Expenses: Not hard at all  Food Insecurity: No Food Insecurity  . Worried About Charity fundraiser in the Last Year: Never true  . Ran Out of Food in the Last Year: Never true  Transportation Needs: No Transportation Needs  . Lack of Transportation (Medical): No  . Lack of Transportation (Non-Medical): No  Physical Activity: Inactive  . Days of Exercise per Week: 0 days  . Minutes of Exercise per Session: 0 min  Stress: No Stress Concern Present  . Feeling of Stress : Only a little  Social Connections: Moderately Isolated  . Frequency of Communication with Friends and Family: More than three times a week  . Frequency of Social Gatherings with Friends and Family: More than three times a week  . Attends Religious Services: Never  . Active Member of Clubs or Organizations: No  . Attends Archivist Meetings: Never  . Marital Status: Living with partner    Tobacco Counseling Counseling given: Not Answered   Clinical Intake:  Pre-visit preparation completed: Yes  Pain : No/denies pain     Nutritional Risks: None Diabetes: No  How often do you need to have someone help you when you read instructions, pamphlets, or other written materials from your doctor or pharmacy?: 1 - Never  Diabetic?  Interpreter Needed?: No  Information entered by :: Clemetine Marker LPN   Activities of Daily Living In your present state of health, do you have any difficulty performing the following activities: 02/11/2020 12/18/2019  Hearing? Y N  Comment has hearing aids; doesn't wear them often -  Vision? N N  Difficulty concentrating or making decisions? N N  Walking or climbing stairs? N N  Dressing or bathing? N N  Doing errands, shopping? N -  Preparing Food and eating ? N -  Using the Toilet? N -  In the past six months, have you accidently leaked urine? N -  Do you have problems with loss of bowel control? N -  Managing your Medications? N -  Managing your Finances? N -    Housekeeping or managing your Housekeeping? N -  Some recent data might be hidden    Patient Care Team: Juline Patch, MD as PCP - General (Family Medicine)  Indicate any recent Medical Services you may have received from other than [facility] providers in the past year (date may be approximate).     Assessment:   This is a routine wellness examination for [place].  Hearing/Vision screen  Hearing Screening   125Hz  250Hz  500Hz  1000Hz  2000Hz  3000Hz  4000Hz  6000Hz  8000Hz   Right ear:           Left ear:           Comments: Pt has hearing aids but does not wear them often   Vision Screening Comments: Past due for eye exam; declines referral  Dietary issues and exercise activities discussed: Current Exercise Habits: The patient has  a physically strenuous job, but has no regular exercise apart from work., Exercise limited by: None identified  Goals   None    Depression Screen PHQ 2/9 Scores 02/11/2020 11/27/2019 11/24/2019 11/05/2019 09/16/2019 04/26/2018 10/24/2017  PHQ - 2 Score 0 0 1 0 4 2 0  PHQ- 9 Score - 0 3 6 9 6  0    Fall Risk Fall Risk  02/11/2020 11/27/2019 11/24/2019 11/05/2019 09/16/2019  Falls in the past year? 0 0 0 0 0  Number falls in past yr: 0 - - - -  Injury with Fall? 0 - - - -  Risk for fall due to : No Fall Risks - - - -  Follow up Falls prevention discussed Falls evaluation completed Falls evaluation completed Falls evaluation completed Falls evaluation completed    Any stairs in or around the home? Yes  If so, are there any without handrails? No    Home free of loose throw rugs in walkways, pet beds, electrical cords, etc? Yes  Adequate lighting in your home to reduce risk of falls? Yes   ASSISTIVE DEVICES UTILIZED TO PREVENT FALLS:  Life alert? No  Use of a cane, walker or w/c? No  Grab bars in the bathroom? No  Shower chair or bench in shower? No  Elevated toilet seat or a handicapped toilet? No   TIMED UP AND GO:  Was the test performed? No . Telephonic  visit.   Cognitive Function: Pt declined 6CIT        Immunizations Immunization History  Administered Date(s) Administered  . Pneumococcal Conjugate-13 06/01/2015  . Pneumococcal Polysaccharide-23 05/17/2017  . Tdap 06/01/2015, 12/19/2016    TDAP status: Up to date   Flu Vaccine status: Declined, Education has been provided regarding the importance of this vaccine but patient still declined. Advised may receive this vaccine at local pharmacy or Health Dept. Aware to provide a copy of the vaccination record if obtained from local pharmacy or Health Dept. Verbalized acceptance and understanding.   Pneumococcal vaccine status: Up to date   Covid-19 vaccine status: Declined, Education has been provided regarding the importance of this vaccine but patient still declined. Advised may receive this vaccine at local pharmacy or Health Dept.or vaccine clinic. Aware to provide a copy of the vaccination record if obtained from local pharmacy or Health Dept. Verbalized acceptance and understanding.  Qualifies for Shingles Vaccine? Yes   Zostavax completed No   Shingrix Completed?: No.    Education has been provided regarding the importance of this vaccine. Patient has been advised to call insurance company to determine out of pocket expense if they have not yet received this vaccine. Advised may also receive vaccine at local pharmacy or Health Dept. Verbalized acceptance and understanding.  Screening Tests Health Maintenance  Topic Date Due  . Hepatitis C Screening  Never done  . COVID-19 Vaccine (1) Never done  . TETANUS/TDAP  12/20/2026  . COLONOSCOPY  12/17/2029  . PNA vac Low Risk Adult  Completed  . INFLUENZA VACCINE  Discontinued    Health Maintenance  Health Maintenance Due  Topic Date Due  . Hepatitis C Screening  Never done  . COVID-19 Vaccine (1) Never done    Colorectal cancer screening: Completed 12/18/19. Repeat every 0 years. No longer required  Lung Cancer Screening:  (Low Dose CT Chest recommended if Age 17-80 years, 30 pack-year currently smoking OR have quit w/in 15years.) does not qualify.   Additional Screening:  Hepatitis C Screening: does qualify; postponed  Vision Screening: Recommended annual ophthalmology exams for early detection of glaucoma and other disorders of the eye. Is the patient up to date with their annual eye exam?  No  Who is the provider or what is the name of the office in which the patient attends annual eye exams? Not established  Dental Screening: Recommended annual dental exams for proper oral hygiene  Community Resource Referral / Chronic Care Management: CRR required this visit?  No   CCM required this visit?  No      Plan:     I have personally reviewed and noted the following in the patient's chart:   . Medical and social history . Use of alcohol, tobacco or illicit drugs  . Current medications and supplements . Functional ability and status . Nutritional status . Physical activity . Advanced directives . List of other physicians . Hospitalizations, surgeries, and ER visits in previous 12 months . Vitals . Screenings to include cognitive, depression, and falls . Referrals and appointments  In addition, I have reviewed and discussed with patient certain preventive protocols, quality metrics, and best practice recommendations. A written personalized care plan for preventive services as well as general preventive health recommendations were provided to patient.     Clemetine Marker, LPN   14/08/3599   Nurse Notes: none

## 2020-04-08 ENCOUNTER — Other Ambulatory Visit: Payer: Self-pay | Admitting: Family Medicine

## 2020-04-08 DIAGNOSIS — I10 Essential (primary) hypertension: Secondary | ICD-10-CM

## 2020-04-08 MED ORDER — AMLODIPINE BESYLATE 10 MG PO TABS: 10.0000 mg | ORAL_TABLET | Freq: Every day | ORAL | 1 refills | Status: DC

## 2020-04-08 NOTE — Telephone Encounter (Signed)
Patient requesting amLODipine (NORVASC) 10 MG tablet, informed please allow 48 to 72 hour turn around time.    [place], [place] Phone:  [phone]  Fax:  [phone]

## 2020-04-19 ENCOUNTER — Other Ambulatory Visit: Payer: Self-pay

## 2020-04-19 ENCOUNTER — Ambulatory Visit (INDEPENDENT_AMBULATORY_CARE_PROVIDER_SITE_OTHER): Payer: Medicare HMO | Admitting: Family Medicine

## 2020-04-19 ENCOUNTER — Encounter: Payer: Self-pay | Admitting: Family Medicine

## 2020-04-19 VITALS — BP 130/70 | HR 80 | Ht 72.0 in | Wt 214.0 lb

## 2020-04-19 DIAGNOSIS — E876 Hypokalemia: Secondary | ICD-10-CM | POA: Diagnosis not present

## 2020-04-19 DIAGNOSIS — I10 Essential (primary) hypertension: Secondary | ICD-10-CM

## 2020-04-19 DIAGNOSIS — E782 Mixed hyperlipidemia: Secondary | ICD-10-CM | POA: Diagnosis not present

## 2020-04-19 MED ORDER — AMLODIPINE BESYLATE 10 MG PO TABS: 10.0000 mg | ORAL_TABLET | Freq: Every day | ORAL | 1 refills | Status: DC

## 2020-04-19 MED ORDER — ROSUVASTATIN CALCIUM 10 MG PO TABS: 10.0000 mg | ORAL_TABLET | ORAL | 5 refills | Status: DC

## 2020-04-19 NOTE — Progress Notes (Signed)
Date:  04/19/2020   Name:  Angel Cummings.   DOB:  [birth year]   MRN:  [id number]   Chief Complaint: Hypertension, Hyperlipidemia (Stopped lipitor d/t stomach upset), and hypokalemia (Has not been taking potassium pills)  Hypertension This is a chronic problem. The current episode started more than 1 year ago. The problem has been gradually improving since onset. The problem is controlled. Pertinent negatives include no anxiety, blurred vision, chest pain, headaches, malaise/fatigue, neck pain, orthopnea, palpitations, peripheral edema, PND, shortness of breath or sweats. There are no associated agents to hypertension. Risk factors for coronary artery disease include dyslipidemia and obesity. Past treatments include calcium channel blockers. The current treatment provides moderate improvement. There are no compliance problems.  There is no history of angina, kidney disease, CAD/MI, CVA, heart failure, left ventricular hypertrophy or PVD. There is no history of chronic renal disease, a hypertension causing med or renovascular disease.  Hyperlipidemia This is a chronic problem. The current episode started more than 1 year ago. The problem is uncontrolled. Recent lipid tests were reviewed and are variable. He has no history of chronic renal disease. Pertinent negatives include no chest pain, focal sensory loss, focal weakness, leg pain, myalgias or shortness of breath. Current antihyperlipidemic treatment includes statins. The current treatment provides no improvement of lipids. Compliance problems include medication side effects.     Lab Results  Component Value Date   CREATININE 0.85 11/27/2019   BUN 9 11/27/2019   NA 140 11/27/2019   K 3.6 11/27/2019   CL 107 11/27/2019   CO2 23 11/27/2019   Lab Results  Component Value Date   CHOL 204 (H) 11/05/2019   HDL 46 11/05/2019   LDLCALC 127 (H) 11/05/2019   TRIG 175 (H) 11/05/2019   CHOLHDL 5.0 06/25/2017   No results found for: TSH No  results found for: HGBA1C Lab Results  Component Value Date   WBC 3.2 (L) 11/27/2019   HGB 13.4 11/27/2019   HCT 38.5 (L) 11/27/2019   MCV 87.7 11/27/2019   PLT 216 11/27/2019   No results found for: ALT, AST, GGT, ALKPHOS, BILITOT   Review of Systems  Constitutional: Negative for chills, fever and malaise/fatigue.  HENT: Negative for drooling, ear discharge, ear pain and sore throat.   Eyes: Negative for blurred vision.  Respiratory: Negative for cough, shortness of breath and wheezing.   Cardiovascular: Negative for chest pain, palpitations, orthopnea, leg swelling and PND.  Gastrointestinal: Negative for abdominal pain, blood in stool, constipation, diarrhea and nausea.  Endocrine: Negative for polydipsia.  Genitourinary: Negative for dysuria, frequency, hematuria and urgency.  Musculoskeletal: Negative for back pain, myalgias and neck pain.  Skin: Negative for rash.  Allergic/Immunologic: Negative for environmental allergies.  Neurological: Negative for dizziness, focal weakness and headaches.  Hematological: Does not bruise/bleed easily.  Psychiatric/Behavioral: Negative for suicidal ideas. The patient is not nervous/anxious.     Patient Active Problem List   Diagnosis Date Noted  . Screen for colon cancer   . Cubital tunnel syndrome on right 07/18/2017  . Radiculopathy of cervical region 07/18/2017  . Acute postoperative pain 12/21/2016  . Open fracture of shaft of right ulna 12/20/2016  . Arthritis of both hands 04/17/2016  . Arthritis of hand 04/17/2016  . Essential hypertension 06/01/2015  . Hyperlipidemia 06/01/2015    Allergies  Allergen Reactions  . Amoxicillin Rash  . Etodolac Rash    Past Surgical History:  Procedure Laterality Date  . COLONOSCOPY  2005  [facility] Vickki Muff in [facility]  . COLONOSCOPY WITH PROPOFOL N/A 12/18/2019   Procedure: COLONOSCOPY WITH PROPOFOL;  Surgeon: Lucilla Lame, MD;  Location: [place];  Service: Endoscopy;  Laterality:  N/A;  . HERNIA REPAIR    . KNEE SURGERY Left     Social History   Tobacco Use  . Smoking status: Never Smoker  . Smokeless tobacco: Never Used  Vaping Use  . Vaping Use: Never used  Substance Use Topics  . Alcohol use: Yes    Alcohol/week: 0.0 standard drinks    Comment: Rare  . Drug use: No     Medication list has been reviewed and updated.  Current Meds  Medication Sig  . acetaminophen (TYLENOL) 650 MG CR tablet Take 1,300 mg by mouth every 8 (eight) hours as needed for pain.  Marland Kitchen amLODipine (NORVASC) 10 MG tablet Take 1 tablet (10 mg total) by mouth daily.  Marland Kitchen aspirin 81 MG tablet Take 1 tablet (81 mg total) by mouth daily.  . Ca Carbonate-Mag Hydroxide (ROLAIDS PO) Take by mouth as needed.  . Cyanocobalamin (VITAMIN B-12 PO) Take by mouth daily.  . fluticasone (FLONASE) 50 MCG/ACT nasal spray Place into both nostrils daily.    PHQ 2/9 Scores 04/19/2020 02/11/2020 11/27/2019 11/24/2019  PHQ - 2 Score 0 0 0 1  PHQ- 9 Score 0 - 0 3    GAD 7 : Generalized Anxiety Score 04/19/2020 11/27/2019 11/24/2019 11/05/2019  Nervous, Anxious, on Edge 0 0 0 0  Control/stop worrying 0 0 0 0  Worry too much - different things 0 0 0 0  Trouble relaxing 0 0 0 2  Restless 0 0 0 0  Easily annoyed or irritable 0 0 0 0  Afraid - awful might happen 0 0 0 0  Total GAD 7 Score 0 0 0 2  Anxiety Difficulty - - - Not difficult at all    BP Readings from Last 3 Encounters:  04/19/20 130/70  12/18/19 (!) 141/81  11/27/19 130/88    Physical Exam Vitals and nursing note reviewed.  HENT:     Head: Normocephalic.     Right Ear: External ear normal.     Left Ear: External ear normal.     Nose: Nose normal.     Mouth/Throat:     Mouth: Oropharynx is clear and moist.  Eyes:     General: No scleral icterus.       Right eye: No discharge.        Left eye: No discharge.     Extraocular Movements: EOM normal.     Conjunctiva/sclera: Conjunctivae normal.     Pupils: Pupils are equal, round, and  reactive to light.  Neck:     Thyroid: No thyromegaly.     Vascular: No JVD.     Trachea: No tracheal deviation.  Cardiovascular:     Rate and Rhythm: Normal rate and regular rhythm.     Pulses: Intact distal pulses.     Heart sounds: Normal heart sounds. No murmur heard. No friction rub. No gallop.   Pulmonary:     Effort: No respiratory distress.     Breath sounds: Normal breath sounds. No wheezing or rales.  Abdominal:     General: Bowel sounds are normal.     Palpations: Abdomen is soft. There is no hepatosplenomegaly or mass.     Tenderness: There is no abdominal tenderness. There is no CVA tenderness, guarding or rebound.  Musculoskeletal:  General: No tenderness or edema. Normal range of motion.     Cervical back: Normal range of motion and neck supple.  Lymphadenopathy:     Cervical: No cervical adenopathy.  Skin:    General: Skin is warm.     Findings: No rash.  Neurological:     Mental Status: He is alert and oriented to person, place, and time.     Cranial Nerves: No cranial nerve deficit.     Deep Tendon Reflexes: Strength normal and reflexes are normal and symmetric.     Wt Readings from Last 3 Encounters:  04/19/20 214 lb (97.1 kg)  12/18/19 204 lb (92.5 kg)  11/27/19 206 lb (93.4 kg)    BP 130/70   Pulse 80   Ht 6' (1.829 m)   Wt 214 lb (97.1 kg)   BMI 29.02 kg/m   Assessment and Plan: 1. Essential hypertension Chronic.  Controlled.  Stable.  Blood pressure 130/70.  Continue amlodipine 10 mg once a day.  We will recheck renal function panel to assess electrolytes and GFR.  Will recheck in 6 months. - Renal Function Panel - amLODipine (NORVASC) 10 MG tablet; Take 1 tablet (10 mg total) by mouth daily.  Dispense: 90 tablet; Refill: 1  2. Mixed hyperlipidemia Chronic.  Controlled.  Stable.  Patient has been unable to take atorvastatin due to side effects of diarrhea.  We will go on rosuvastatin 10 mg once a week.  Will check lipid panel and  recheck in 6 months. - Lipid Panel With LDL/HDL Ratio - rosuvastatin (CRESTOR) 10 MG tablet; Take 1 tablet (10 mg total) by mouth once a week.  Dispense: 4 tablet; Refill: 5  3. Hypokalemia Chronic.  Uncontrolled.  Stable.  Patient has a history of hypokalemia.  But patient has not been taking his potassium supplement due to thinking that hydration was sufficient to correct it.  Rather than restarting potassium yet we will check renal function panel to look at a potassium level to see if this needs to be resumed. - Potassium

## 2020-04-20 LAB — LIPID PANEL WITH LDL/HDL RATIO
Cholesterol, Total: 221 mg/dL — ABNORMAL HIGH (ref 100–199)
HDL: 42 mg/dL (ref >39)
LDL Chol Calc (NIH): 146 mg/dL — ABNORMAL HIGH (ref 0–99)
LDL/HDL Ratio: 3.5 ratio (ref 0.0–3.6)
Triglycerides: 182 mg/dL — ABNORMAL HIGH (ref 0–149)
VLDL Cholesterol Cal: 33 mg/dL (ref 5–40)

## 2020-04-20 LAB — RENAL FUNCTION PANEL
Albumin: 4.7 g/dL (ref 3.7–4.7)
BUN/Creatinine Ratio: 11 (ref 10–24)
BUN: 11 mg/dL (ref 8–27)
CO2: 24 mmol/L (ref 20–29)
Calcium: 8.7 mg/dL (ref 8.6–10.2)
Chloride: 104 mmol/L (ref 96–106)
Creatinine, Ser: 1.03 mg/dL (ref 0.76–1.27)
GFR calc Af Amer: 84 mL/min/{1.73_m2} (ref >59)
GFR calc non Af Amer: 72 mL/min/{1.73_m2} (ref >59)
Glucose: 107 mg/dL — ABNORMAL HIGH (ref 65–99)
Phosphorus: 3.2 mg/dL (ref 2.8–4.1)
Potassium: 4 mmol/L (ref 3.5–5.2)
Sodium: 143 mmol/L (ref 134–144)

## 2020-04-27 DIAGNOSIS — C44519 Basal cell carcinoma of skin of other part of trunk: Secondary | ICD-10-CM | POA: Diagnosis not present

## 2020-04-27 DIAGNOSIS — D225 Melanocytic nevi of trunk: Secondary | ICD-10-CM | POA: Diagnosis not present

## 2020-05-11 DIAGNOSIS — C44319 Basal cell carcinoma of skin of other parts of face: Secondary | ICD-10-CM | POA: Diagnosis not present

## 2020-09-27 DIAGNOSIS — C4441 Basal cell carcinoma of skin of scalp and neck: Secondary | ICD-10-CM | POA: Diagnosis not present

## 2020-09-27 DIAGNOSIS — L910 Hypertrophic scar: Secondary | ICD-10-CM | POA: Diagnosis not present

## 2020-09-27 DIAGNOSIS — L570 Actinic keratosis: Secondary | ICD-10-CM | POA: Diagnosis not present

## 2020-09-27 DIAGNOSIS — D485 Neoplasm of uncertain behavior of skin: Secondary | ICD-10-CM | POA: Diagnosis not present

## 2020-10-06 ENCOUNTER — Other Ambulatory Visit: Payer: Self-pay

## 2020-10-06 ENCOUNTER — Encounter: Payer: Self-pay | Admitting: Family Medicine

## 2020-10-06 ENCOUNTER — Ambulatory Visit (INDEPENDENT_AMBULATORY_CARE_PROVIDER_SITE_OTHER): Payer: Medicare HMO | Admitting: Family Medicine

## 2020-10-06 VITALS — BP 140/80 | HR 68 | Ht 72.0 in | Wt 213.0 lb

## 2020-10-06 DIAGNOSIS — E782 Mixed hyperlipidemia: Secondary | ICD-10-CM

## 2020-10-06 DIAGNOSIS — L282 Other prurigo: Secondary | ICD-10-CM | POA: Diagnosis not present

## 2020-10-06 DIAGNOSIS — I10 Essential (primary) hypertension: Secondary | ICD-10-CM

## 2020-10-06 MED ORDER — LOSARTAN POTASSIUM 50 MG PO TABS: 50.0000 mg | ORAL_TABLET | Freq: Every day | ORAL | 0 refills | Status: DC

## 2020-10-06 MED ORDER — LORATADINE 10 MG PO TABS: 10.0000 mg | ORAL_TABLET | Freq: Every day | ORAL | 11 refills | Status: DC

## 2020-10-06 MED ORDER — ROSUVASTATIN CALCIUM 10 MG PO TABS: 10.0000 mg | ORAL_TABLET | ORAL | 5 refills | Status: DC

## 2020-10-06 MED ORDER — AMLODIPINE BESYLATE 10 MG PO TABS: 10.0000 mg | ORAL_TABLET | Freq: Every day | ORAL | 1 refills | Status: DC

## 2020-10-06 NOTE — Progress Notes (Signed)
Date:  10/06/2020   Name:  Angel Cummings.   DOB:  August 01, 1945   MRN:  [id number]   Chief Complaint: Hypertension, Hyperlipidemia, and Rash (On arms- using clotrimazole cream)  Hypertension This is a chronic problem. The current episode started more than 1 year ago. The problem has been gradually worsening since onset. The problem is uncontrolled. Pertinent negatives include no anxiety, blurred vision, chest pain, headaches, malaise/fatigue, neck pain, orthopnea, palpitations, peripheral edema, PND, shortness of breath or sweats. There are no associated agents to hypertension. Risk factors for coronary artery disease include dyslipidemia and male gender. Past treatments include calcium channel blockers. The current treatment provides moderate improvement. There are no compliance problems.  There is no history of angina, kidney disease, CAD/MI, CVA, heart failure, left ventricular hypertrophy, PVD or retinopathy. There is no history of chronic renal disease, a hypertension causing med or renovascular disease.  Hyperlipidemia This is a chronic problem. The current episode started more than 1 year ago. Recent lipid tests were reviewed and are normal. He has no history of chronic renal disease, diabetes or hypothyroidism. Pertinent negatives include no chest pain, myalgias or shortness of breath. The current treatment provides moderate improvement of lipids. There are no compliance problems.   Rash This is a chronic problem. The current episode started more than 1 year ago. The problem has been waxing and waning since onset. Location: back. Pertinent negatives include no cough, diarrhea, fever, shortness of breath or sore throat.    Lab Results  Component Value Date   CREATININE 1.03 04/19/2020   BUN 11 04/19/2020   NA 143 04/19/2020   K 4.0 04/19/2020   CL 104 04/19/2020   CO2 24 04/19/2020   Lab Results  Component Value Date   CHOL 221 (H) 04/19/2020   HDL 42 04/19/2020   LDLCALC 146 (H)  04/19/2020   TRIG 182 (H) 04/19/2020   CHOLHDL 5.0 06/25/2017   No results found for: TSH No results found for: HGBA1C Lab Results  Component Value Date   WBC 3.2 (L) 11/27/2019   HGB 13.4 11/27/2019   HCT 38.5 (L) 11/27/2019   MCV 87.7 11/27/2019   PLT 216 11/27/2019   No results found for: ALT, AST, GGT, ALKPHOS, BILITOT   Review of Systems  Constitutional: Negative for chills, fever and malaise/fatigue.  HENT: Negative for drooling, ear discharge, ear pain and sore throat.   Eyes: Negative for blurred vision.  Respiratory: Negative for cough, shortness of breath and wheezing.   Cardiovascular: Negative for chest pain, palpitations, orthopnea, leg swelling and PND.  Gastrointestinal: Negative for abdominal pain, blood in stool, constipation, diarrhea and nausea.  Endocrine: Negative for polydipsia.  Genitourinary: Negative for dysuria, frequency, hematuria and urgency.  Musculoskeletal: Negative for back pain, myalgias and neck pain.  Skin: Positive for rash.  Allergic/Immunologic: Negative for environmental allergies.  Neurological: Negative for dizziness and headaches.  Hematological: Does not bruise/bleed easily.  Psychiatric/Behavioral: Negative for suicidal ideas. The patient is not nervous/anxious.     Patient Active Problem List   Diagnosis Date Noted  . Screen for colon cancer   . Cubital tunnel syndrome on right 07/18/2017  . Radiculopathy of cervical region 07/18/2017  . Acute postoperative pain 12/21/2016  . Open fracture of shaft of right ulna 12/20/2016  . Arthritis of both hands 04/17/2016  . Arthritis of hand 04/17/2016  . Essential hypertension 06/01/2015  . Hyperlipidemia 06/01/2015    Allergies  Allergen Reactions  . Amoxicillin Rash  .  Etodolac Rash    Past Surgical History:  Procedure Laterality Date  . COLONOSCOPY  2005   Dr Vickki Muff in [facility]  . COLONOSCOPY WITH PROPOFOL N/A 12/18/2019   Procedure: COLONOSCOPY WITH PROPOFOL;  Surgeon:  Lucilla Lame, MD;  Location: [place];  Service: Endoscopy;  Laterality: N/A;  . HERNIA REPAIR    . KNEE SURGERY Left     Social History   Tobacco Use  . Smoking status: Never Smoker  . Smokeless tobacco: Never Used  Vaping Use  . Vaping Use: Never used  Substance Use Topics  . Alcohol use: Yes    Alcohol/week: 0.0 standard drinks    Comment: Rare  . Drug use: No     Medication list has been reviewed and updated.  Current Meds  Medication Sig  . acetaminophen (TYLENOL) 650 MG CR tablet Take 1,300 mg by mouth every 8 (eight) hours as needed for pain.  Marland Kitchen amLODipine (NORVASC) 10 MG tablet Take 1 tablet (10 mg total) by mouth daily.  Marland Kitchen aspirin 81 MG tablet Take 1 tablet (81 mg total) by mouth daily.  . Ca Carbonate-Mag Hydroxide (ROLAIDS PO) Take by mouth as needed.  . Cyanocobalamin (VITAMIN B-12 PO) Take by mouth daily.  . fluticasone (FLONASE) 50 MCG/ACT nasal spray Place into both nostrils daily.  . rosuvastatin (CRESTOR) 10 MG tablet Take 1 tablet (10 mg total) by mouth once a week.    PHQ 2/9 Scores 10/06/2020 04/19/2020 02/11/2020 11/27/2019  PHQ - 2 Score 0 0 0 0  PHQ- 9 Score 0 0 - 0    GAD 7 : Generalized Anxiety Score 10/06/2020 04/19/2020 11/27/2019 11/24/2019  Nervous, Anxious, on Edge 0 0 0 0  Control/stop worrying 0 0 0 0  Worry too much - different things 0 0 0 0  Trouble relaxing 0 0 0 0  Restless 0 0 0 0  Easily annoyed or irritable 0 0 0 0  Afraid - awful might happen 0 0 0 0  Total GAD 7 Score 0 0 0 0  Anxiety Difficulty - - - -    BP Readings from Last 3 Encounters:  10/06/20 140/80  04/19/20 130/70  12/18/19 (!) 141/81    Physical Exam Vitals and nursing note reviewed.  HENT:     Head: Normocephalic.     Right Ear: Tympanic membrane and external ear normal.     Left Ear: Tympanic membrane and external ear normal.     Nose: Nose normal.  Eyes:     General: No scleral icterus.       Right eye: No discharge.        Left eye: No  discharge.     Conjunctiva/sclera: Conjunctivae normal.     Pupils: Pupils are equal, round, and reactive to light.  Neck:     Thyroid: No thyromegaly.     Vascular: No JVD.     Trachea: No tracheal deviation.  Cardiovascular:     Rate and Rhythm: Normal rate and regular rhythm.     Heart sounds: Normal heart sounds, S1 normal and S2 normal. No murmur heard.  No systolic murmur is present.  No diastolic murmur is present. No friction rub. No gallop.   Pulmonary:     Effort: No respiratory distress.     Breath sounds: Normal breath sounds. No wheezing or rales.  Abdominal:     General: Bowel sounds are normal.     Palpations: Abdomen is soft. There is no mass.  Tenderness: There is no abdominal tenderness. There is no guarding or rebound.  Musculoskeletal:        General: No tenderness. Normal range of motion.     Cervical back: Normal range of motion and neck supple.  Lymphadenopathy:     Cervical: No cervical adenopathy.  Skin:    General: Skin is warm.     Findings: No rash.  Neurological:     Mental Status: He is alert and oriented to person, place, and time.     Cranial Nerves: No cranial nerve deficit.     Deep Tendon Reflexes: Reflexes are normal and symmetric.     Wt Readings from Last 3 Encounters:  10/06/20 213 lb (96.6 kg)  04/19/20 214 lb (97.1 kg)  12/18/19 204 lb (92.5 kg)    BP 140/80   Pulse 68   Ht 6' (1.829 m)   Wt 213 lb (96.6 kg)   BMI 28.89 kg/m   Assessment and Plan:  1. Essential hypertension Chronic.  Uncontrolled.  Relatively stable.  Uncomplicated.  In addition to his amlodipine 10 mg once a day we will add losartan 50 mg once a day and will recheck blood pressure in 6 weeks.  We will check renal function panel for current GFR and electrolytes. - losartan (COZAAR) 50 MG tablet; Take 1 tablet (50 mg total) by mouth daily.  Dispense: 60 tablet; Refill: 0 - amLODipine (NORVASC) 10 MG tablet; Take 1 tablet (10 mg total) by mouth daily.   Dispense: 90 tablet; Refill: 1 - Renal Function Panel  2. Mixed hyperlipidemia Chronic.  Controlled.  Stable.  We will check a lipid panel and see if once a week dosing of Crestor is sufficient. - rosuvastatin (CRESTOR) 10 MG tablet; Take 1 tablet (10 mg total) by mouth once a week.  Dispense: 4 tablet; Refill: 5 - Lipid Panel With LDL/HDL Ratio  3. Papular urticaria Patient has urticarial rash that is in the axillary area in particular he notices after he gets out of the shower which may be secondary to the hot water causing a histamine release response.  I have called in some loratadine to try to see if this will help control his urticaria and patient will be stopping by his dermatologist to arrange an appointment. - loratadine (CLARITIN) 10 MG tablet; Take 1 tablet (10 mg total) by mouth daily.  Dispense: 30 tablet; Refill: 11

## 2020-10-07 LAB — RENAL FUNCTION PANEL
Albumin: 4.4 g/dL (ref 3.7–4.7)
BUN/Creatinine Ratio: 10 (ref 10–24)
BUN: 9 mg/dL (ref 8–27)
CO2: 23 mmol/L (ref 20–29)
Calcium: 9 mg/dL (ref 8.6–10.2)
Chloride: 104 mmol/L (ref 96–106)
Creatinine, Ser: 0.94 mg/dL (ref 0.76–1.27)
Glucose: 105 mg/dL — ABNORMAL HIGH (ref 65–99)
Phosphorus: 3.3 mg/dL (ref 2.8–4.1)
Potassium: 3.5 mmol/L (ref 3.5–5.2)
Sodium: 143 mmol/L (ref 134–144)
eGFR: 86 mL/min/{1.73_m2} (ref >59)

## 2020-10-07 LAB — LIPID PANEL WITH LDL/HDL RATIO
Cholesterol, Total: 173 mg/dL (ref 100–199)
HDL: 42 mg/dL (ref >39)
LDL Chol Calc (NIH): 109 mg/dL — ABNORMAL HIGH (ref 0–99)
LDL/HDL Ratio: 2.6 ratio (ref 0.0–3.6)
Triglycerides: 125 mg/dL (ref 0–149)
VLDL Cholesterol Cal: 22 mg/dL (ref 5–40)

## 2020-10-14 DIAGNOSIS — C44319 Basal cell carcinoma of skin of other parts of face: Secondary | ICD-10-CM | POA: Diagnosis not present

## 2020-10-26 DIAGNOSIS — C4441 Basal cell carcinoma of skin of scalp and neck: Secondary | ICD-10-CM | POA: Diagnosis not present

## 2020-10-26 DIAGNOSIS — C4491 Basal cell carcinoma of skin, unspecified: Secondary | ICD-10-CM | POA: Diagnosis not present

## 2020-11-04 ENCOUNTER — Encounter: Payer: Self-pay | Admitting: Family Medicine

## 2020-11-04 ENCOUNTER — Ambulatory Visit (INDEPENDENT_AMBULATORY_CARE_PROVIDER_SITE_OTHER): Payer: Medicare HMO | Admitting: Family Medicine

## 2020-11-04 ENCOUNTER — Other Ambulatory Visit: Payer: Self-pay

## 2020-11-04 VITALS — BP 138/70 | HR 80 | Ht 72.0 in | Wt 212.0 lb

## 2020-11-04 DIAGNOSIS — B356 Tinea cruris: Secondary | ICD-10-CM

## 2020-11-04 DIAGNOSIS — E782 Mixed hyperlipidemia: Secondary | ICD-10-CM | POA: Diagnosis not present

## 2020-11-04 DIAGNOSIS — L282 Other prurigo: Secondary | ICD-10-CM | POA: Diagnosis not present

## 2020-11-04 DIAGNOSIS — I10 Essential (primary) hypertension: Secondary | ICD-10-CM

## 2020-11-04 MED ORDER — LOSARTAN POTASSIUM 50 MG PO TABS: 50.0000 mg | ORAL_TABLET | Freq: Every day | ORAL | 1 refills | Status: DC

## 2020-11-04 MED ORDER — AMLODIPINE BESYLATE 10 MG PO TABS: 10.0000 mg | ORAL_TABLET | Freq: Every day | ORAL | 1 refills | Status: DC

## 2020-11-04 MED ORDER — ROSUVASTATIN CALCIUM 10 MG PO TABS
10.0000 mg | ORAL_TABLET | ORAL | 1 refills | Status: DC
Start: 2020-11-04 — End: 2021-05-10

## 2020-11-04 NOTE — Patient Instructions (Signed)
Jock Itch Jock itch (tinea cruris) is an infection of the skin in the groin area. It is caused by a fungus, which is a type of germ that lives in dark, damp places. Jock itch causes an itchy rash in the groin and upper thigh area. It usually goes away in 2-3 weekswith treatment. What are the causes? The fungus that causes jock itch may be spread by: Touching a fungus infection elsewhere on your body, such as athlete's foot, and then touching your groin area. Sharing towels or clothing, such as socks or shoes, with someone who has a fungal infection. What increases the risk? Jock itch is most common in men and adolescent boys. You are also more likely to develop the condition if you: Are in a hot, humid climate. Wear tight-fitting clothing or wet bathing suits for long periods of time. Play sports. Are overweight. Have diabetes. Have a weakened immune system. Sweat a lot. What are the signs or symptoms? Symptoms of jock itch may include: A red, pink, or brown rash in the groin area. Blisters may be present. The rash may spread to the thighs, anus, and buttocks. Dry and scaly skin on or around the rash. Itchiness. How is this diagnosed? In most cases, your health care provider can make the diagnosis by looking at your rash. In some cases, a sample of infected skin may be scraped off. This sample may be examined under a microscope (biopsy) or by trying to grow the fungus from the sample (culture). How is this treated? Treatment for this condition may include: Antifungal medicine to kill the fungus. This may be a skin cream, ointment, or powder, or it may be a medicine that you take by mouth. Skin cream or ointment to reduce itching. Lifestyle changes, such as wearing looser clothing and caring for your skin. Follow these instructions at home: Skin care Apply skin creams, ointments, or powders exactly as told by your health care provider. Wear loose-fitting clothing that does not rub  against your groin area. Men should wear boxer shorts or loose-fitting underwear. Keep your groin area clean and dry. Change your underwear every day. Change out of wet bathing suits as soon as possible. After bathing, use a separate towel to dry your groin area thoroughly and gently. Using a separate towel will help prevent spreading the infection to other areas of your body. Avoid hot baths and showers. Hot water can make itching worse. Do not scratch the affected area. General instructions Take and apply over-the-counter and prescription medicines only as told by your health care provider. Do not share towels, clothing, or personal items with other people. Wash your hands often with soap and water, especially after touching your groin area. If soap and water are not available, use alcohol-based hand sanitizer. Contact a health care provider if: Your rash: Gets worse or does not get better after 2 weeks of treatment. Spreads. Returns after treatment is finished. You have any of the following: A fever. New or worsening redness, swelling, or pain around your rash. Fluid, blood, or pus coming from your rash. Summary Jock itch (tinea cruris) is a fungal infection of the skin in the groin area. The fungus can be spread by sharing clothing or by touching a fungus infection elsewhere on your body and then touching your groin area. Treatment may include antifungal medicine and lifestyle changes, such as keeping the area clean and dry. This information is not intended to replace advice given to you by your health care provider.  Make sure you discuss any questions you have with your healthcare provider. Document Revised: 02/17/2020 Document Reviewed: 02/18/2020 Elsevier Patient Education  2022 [employer].

## 2020-11-04 NOTE — Progress Notes (Signed)
Date:  11/04/2020   Name:  Angel Cummings.   DOB:  12-16-1945   MRN:  [id number]   Chief Complaint: Hypertension (After adding losartan to amlodipine) and Hyperlipidemia (Wanted 90 days sent in instead of 30)  Hypertension This is a chronic problem. The current episode started more than 1 year ago. The problem has been waxing and waning since onset. The problem is controlled. Pertinent negatives include no anxiety, blurred vision, chest pain, headaches, malaise/fatigue, neck pain, orthopnea, palpitations, peripheral edema, PND, shortness of breath or sweats. There are no associated agents to hypertension. There are no known risk factors for coronary artery disease. Past treatments include angiotensin blockers and calcium channel blockers. The current treatment provides moderate improvement. There are no compliance problems.  There is no history of angina, kidney disease, CAD/MI, CVA, heart failure, left ventricular hypertrophy, PVD or retinopathy. There is no history of chronic renal disease, a hypertension causing med or renovascular disease.  Hyperlipidemia This is a chronic problem. The problem is controlled. Recent lipid tests were reviewed and are normal. He has no history of chronic renal disease. Pertinent negatives include no chest pain, focal sensory loss, focal weakness, leg pain, myalgias or shortness of breath. Current antihyperlipidemic treatment includes statins. The current treatment provides moderate improvement of lipids. There are no compliance problems.  Risk factors for coronary artery disease include hypertension and dyslipidemia.   Lab Results  Component Value Date   CREATININE 0.94 10/06/2020   BUN 9 10/06/2020   NA 143 10/06/2020   K 3.5 10/06/2020   CL 104 10/06/2020   CO2 23 10/06/2020   Lab Results  Component Value Date   CHOL 173 10/06/2020   HDL 42 10/06/2020   LDLCALC 109 (H) 10/06/2020   TRIG 125 10/06/2020   CHOLHDL 5.0 06/25/2017   No results found  for: TSH No results found for: HGBA1C Lab Results  Component Value Date   WBC 3.2 (L) 11/27/2019   HGB 13.4 11/27/2019   HCT 38.5 (L) 11/27/2019   MCV 87.7 11/27/2019   PLT 216 11/27/2019   No results found for: ALT, AST, GGT, ALKPHOS, BILITOT   Review of Systems  Constitutional:  Negative for chills, fever and malaise/fatigue.  HENT:  Negative for drooling, ear discharge, ear pain and sore throat.   Eyes:  Negative for blurred vision.  Respiratory:  Negative for cough, shortness of breath and wheezing.   Cardiovascular:  Negative for chest pain, palpitations, orthopnea, leg swelling and PND.  Gastrointestinal:  Negative for abdominal pain, blood in stool, constipation, diarrhea and nausea.  Endocrine: Negative for polydipsia.  Genitourinary:  Negative for dysuria, frequency, hematuria and urgency.  Musculoskeletal:  Negative for back pain, myalgias and neck pain.  Skin:  Negative for rash.  Allergic/Immunologic: Negative for environmental allergies.  Neurological:  Negative for dizziness, focal weakness and headaches.  Hematological:  Does not bruise/bleed easily.  Psychiatric/Behavioral:  Negative for suicidal ideas. The patient is not nervous/anxious.    Patient Active Problem List   Diagnosis Date Noted   Screen for colon cancer    Cubital tunnel syndrome on right 07/18/2017   Radiculopathy of cervical region 07/18/2017   Acute postoperative pain 12/21/2016   Open fracture of shaft of right ulna 12/20/2016   Arthritis of both hands 04/17/2016   Arthritis of hand 04/17/2016   Essential hypertension 06/01/2015   Hyperlipidemia 06/01/2015    Allergies  Allergen Reactions   Amoxicillin Rash   Etodolac Rash  Past Surgical History:  Procedure Laterality Date   COLONOSCOPY  2005   Dr Vickki Muff in [place] PROPOFOL N/A 12/18/2019   Procedure: COLONOSCOPY WITH PROPOFOL;  Surgeon: Lucilla Lame, MD;  Location: [place];  Service: Endoscopy;   Laterality: N/A;   HERNIA REPAIR     KNEE SURGERY Left     Social History   Tobacco Use   Smoking status: Never   Smokeless tobacco: Never  Vaping Use   Vaping Use: Never used  Substance Use Topics   Alcohol use: Yes    Alcohol/week: 0.0 standard drinks    Comment: Rare   Drug use: No     Medication list has been reviewed and updated.  Current Meds  Medication Sig   acetaminophen (TYLENOL) 650 MG CR tablet Take 1,300 mg by mouth every 8 (eight) hours as needed for pain.   amLODipine (NORVASC) 10 MG tablet Take 1 tablet (10 mg total) by mouth daily.   aspirin 81 MG tablet Take 1 tablet (81 mg total) by mouth daily.   Ca Carbonate-Mag Hydroxide (ROLAIDS PO) Take by mouth as needed.   Cyanocobalamin (VITAMIN B-12 PO) Take by mouth daily.   hydrOXYzine (ATARAX/VISTARIL) 25 MG tablet Take 25 mg by mouth 3 (three) times daily as needed. derm   loratadine (CLARITIN) 10 MG tablet Take 1 tablet (10 mg total) by mouth daily.   losartan (COZAAR) 50 MG tablet Take 1 tablet (50 mg total) by mouth daily.   rosuvastatin (CRESTOR) 10 MG tablet Take 1 tablet (10 mg total) by mouth once a week.    PHQ 2/9 Scores 10/06/2020 04/19/2020 02/11/2020 11/27/2019  PHQ - 2 Score 0 0 0 0  PHQ- 9 Score 0 0 - 0    GAD 7 : Generalized Anxiety Score 10/06/2020 04/19/2020 11/27/2019 11/24/2019  Nervous, Anxious, on Edge 0 0 0 0  Control/stop worrying 0 0 0 0  Worry too much - different things 0 0 0 0  Trouble relaxing 0 0 0 0  Restless 0 0 0 0  Easily annoyed or irritable 0 0 0 0  Afraid - awful might happen 0 0 0 0  Total GAD 7 Score 0 0 0 0  Anxiety Difficulty - - - -    BP Readings from Last 3 Encounters:  11/04/20 138/70  10/06/20 140/80  04/19/20 130/70    Physical Exam Vitals and nursing note reviewed.  HENT:     Head: Normocephalic.     Right Ear: Tympanic membrane and external ear normal.     Left Ear: Tympanic membrane and external ear normal.     Nose: Nose normal. No congestion or  rhinorrhea.  Eyes:     General: No scleral icterus.       Right eye: No discharge.        Left eye: No discharge.     Conjunctiva/sclera: Conjunctivae normal.     Pupils: Pupils are equal, round, and reactive to light.  Neck:     Thyroid: No thyromegaly.     Vascular: No JVD.     Trachea: No tracheal deviation.  Cardiovascular:     Rate and Rhythm: Normal rate and regular rhythm.     Heart sounds: Normal heart sounds. No murmur heard.   No friction rub. No gallop.  Pulmonary:     Effort: No respiratory distress.     Breath sounds: Normal breath sounds. No wheezing, rhonchi or rales.  Abdominal:     General:  Bowel sounds are normal.     Palpations: Abdomen is soft. There is no mass.     Tenderness: There is no abdominal tenderness. There is no guarding or rebound.  Musculoskeletal:        General: No tenderness. Normal range of motion.     Cervical back: Normal range of motion and neck supple.  Lymphadenopathy:     Cervical: No cervical adenopathy.  Skin:    General: Skin is warm.     Findings: No rash.  Neurological:     Mental Status: He is alert and oriented to person, place, and time.     Cranial Nerves: No cranial nerve deficit.     Deep Tendon Reflexes: Reflexes are normal and symmetric.    Wt Readings from Last 3 Encounters:  11/04/20 212 lb (96.2 kg)  10/06/20 213 lb (96.6 kg)  04/19/20 214 lb (97.1 kg)    BP 138/70   Pulse 80   Ht 6' (1.829 m)   Wt 212 lb (96.2 kg)   BMI 28.75 kg/m   Assessment and Plan:  1. Essential hypertension Chronic.  Controlled.  Stable.  Blood pressure today is 138/70.  We will continue amlodipine 10 mg once a day and losartan 50 mg once a day. - amLODipine (NORVASC) 10 MG tablet; Take 1 tablet (10 mg total) by mouth daily.  Dispense: 90 tablet; Refill: 1 - losartan (COZAAR) 50 MG tablet; Take 1 tablet (50 mg total) by mouth daily.  Dispense: 90 tablet; Refill: 1  2. Mixed hyperlipidemia Chronic.  Controlled.  Stable.  Patient  will continue Crestor 10 mg once a week. - rosuvastatin (CRESTOR) 10 MG tablet; Take 1 tablet (10 mg total) by mouth once a week.  Dispense: 12 tablet; Refill: 1  3. Papular urticaria New onset.  Episodic.  Relatively stable.  But is unable to tolerate this Thiosol and hydroxyzine combination we will switch to Claritin 10 mg in the morning followed by Benadryl over-the-counter dosing at night.  4. Tinea cruris Patient describes a tinea cruris like rash that he develops particularly when he is driving long distances and gets hot.  I discussed with him that this is not so much of a allergic rash due to a detergent as I suspect it is a fungal like eruption due to the heat moisture and relative inactivity when he is driving long distances.  I have suggested that he obtain some antifungal agent over-the-counter such as Lamisil for resolution.

## 2020-11-11 DIAGNOSIS — L508 Other urticaria: Secondary | ICD-10-CM | POA: Diagnosis not present

## 2021-01-13 DIAGNOSIS — N529 Male erectile dysfunction, unspecified: Secondary | ICD-10-CM | POA: Diagnosis not present

## 2021-01-13 DIAGNOSIS — M1990 Unspecified osteoarthritis, unspecified site: Secondary | ICD-10-CM | POA: Diagnosis not present

## 2021-01-13 DIAGNOSIS — Z008 Encounter for other general examination: Secondary | ICD-10-CM | POA: Diagnosis not present

## 2021-01-13 DIAGNOSIS — I10 Essential (primary) hypertension: Secondary | ICD-10-CM | POA: Diagnosis not present

## 2021-01-13 DIAGNOSIS — Z880 Allergy status to penicillin: Secondary | ICD-10-CM | POA: Diagnosis not present

## 2021-01-13 DIAGNOSIS — Z809 Family history of malignant neoplasm, unspecified: Secondary | ICD-10-CM | POA: Diagnosis not present

## 2021-01-13 DIAGNOSIS — G8929 Other chronic pain: Secondary | ICD-10-CM | POA: Diagnosis not present

## 2021-01-13 DIAGNOSIS — E785 Hyperlipidemia, unspecified: Secondary | ICD-10-CM | POA: Diagnosis not present

## 2021-01-13 DIAGNOSIS — Z7982 Long term (current) use of aspirin: Secondary | ICD-10-CM | POA: Diagnosis not present

## 2021-01-13 DIAGNOSIS — E663 Overweight: Secondary | ICD-10-CM | POA: Diagnosis not present

## 2021-02-14 ENCOUNTER — Other Ambulatory Visit: Payer: Self-pay

## 2021-02-14 ENCOUNTER — Ambulatory Visit (INDEPENDENT_AMBULATORY_CARE_PROVIDER_SITE_OTHER): Payer: Medicare HMO

## 2021-02-14 VITALS — BP 148/90 | HR 69 | Temp 97.9°F | Resp 16 | Ht 72.0 in | Wt 216.4 lb

## 2021-02-14 DIAGNOSIS — Z0000 Encounter for general adult medical examination without abnormal findings: Secondary | ICD-10-CM | POA: Diagnosis not present

## 2021-02-14 NOTE — Progress Notes (Signed)
Subjective:   Angel Cummings. is a 75 y.o. male who presents for Medicare Annual/Subsequent preventive examination.  Review of Systems     Cardiac Risk Factors include: advanced age (>39men, >93 women);dyslipidemia;male gender;hypertension     Objective:    Today's Vitals   02/14/21 0818  BP: (!) 148/90  Pulse: 69  Resp: 16  Temp: 97.9 F (36.6 C)  TempSrc: Oral  SpO2: 97%  Weight: 216 lb 6.4 oz (98.2 kg)  Height: 6' (1.829 m)   Body mass index is 29.35 kg/m.  Advanced Directives 02/14/2021 02/11/2020 12/18/2019  Does Patient Have a Medical Advance Directive? No No No  Would patient like information on creating a medical advance directive? No - Patient declined No - Patient declined No - Patient declined    Current Medications (verified) Outpatient Encounter Medications as of 02/14/2021  Medication Sig   acetaminophen (TYLENOL) 650 MG CR tablet Take 1,300 mg by mouth every 8 (eight) hours as needed for pain.   amLODipine (NORVASC) 10 MG tablet Take 1 tablet (10 mg total) by mouth daily.   aspirin 81 MG tablet Take 1 tablet (81 mg total) by mouth daily.   Ca Carbonate-Mag Hydroxide (ROLAIDS PO) Take by mouth as needed.   cetirizine (ZYRTEC) 10 MG tablet Take 10 mg by mouth at bedtime.   Cyanocobalamin (VITAMIN B-12 PO) Take by mouth daily.   diphenhydrAMINE (BENADRYL) 25 MG tablet Take 25 mg by mouth every 6 (six) hours as needed.   loratadine (CLARITIN) 10 MG tablet Take 1 tablet (10 mg total) by mouth daily.   losartan (COZAAR) 50 MG tablet Take 1 tablet (50 mg total) by mouth daily.   rosuvastatin (CRESTOR) 10 MG tablet Take 1 tablet (10 mg total) by mouth once a week.   [DISCONTINUED] fluticasone (FLONASE) 50 MCG/ACT nasal spray Place into both nostrils daily. (Patient not taking: Reported on 11/04/2020)   [DISCONTINUED] hydrOXYzine (ATARAX/VISTARIL) 25 MG tablet Take 25 mg by mouth 3 (three) times daily as needed. derm   [DISCONTINUED] levocetirizine (XYZAL) 5 MG  tablet Take 5 mg by mouth daily. derm   No facility-administered encounter medications on file as of 02/14/2021.    Allergies (verified) Amoxicillin and Etodolac   History: Past Medical History:  Diagnosis Date   Arthritis    Carpal tunnel syndrome on both sides    GERD (gastroesophageal reflux disease)    Hyperlipidemia    Hypertension    Wears hearing aid in both ears    Has.  Does not wear   Past Surgical History:  Procedure Laterality Date   COLONOSCOPY  2005   Dr Vickki Muff in [place] N/A 12/18/2019   Procedure: COLONOSCOPY WITH PROPOFOL;  Surgeon: Lucilla Lame, MD;  Location: [facility];  Service: Endoscopy;  Laterality: N/A;   HERNIA REPAIR     KNEE SURGERY Left    History reviewed. No pertinent family history. Social History   Socioeconomic History   Marital status: Significant Other    Spouse name: Not on file   Number of children: 1   Years of education: Not on file   Highest education level: Not on file  Occupational History   Not on file  Tobacco Use   Smoking status: Never   Smokeless tobacco: Never  Vaping Use   Vaping Use: Never used  Substance and Sexual Activity   Alcohol use: Yes    Alcohol/week: 2.0 standard drinks    Types: 2 Cans of beer  per week    Comment: 2-3 times per week   Drug use: No   Sexual activity: Not Currently  Other Topics Concern   Not on file  Social History Narrative   Lives with significant other   Social Determinants of Health   Financial Resource Strain: Low Risk    Difficulty of Paying Living Expenses: Not hard at all  Food Insecurity: No Food Insecurity   Worried About Charity fundraiser in the Last Year: Never true   [place] in the Last Year: Never true  Transportation Needs: No Transportation Needs   Lack of Transportation (Medical): No   Lack of Transportation (Non-Medical): No  Physical Activity: Inactive   Days of Exercise per Week: 0 days   Minutes of Exercise  per Session: 0 min  Stress: No Stress Concern Present   Feeling of Stress : Not at all  Social Connections: Moderately Isolated   Frequency of Communication with Friends and Family: More than three times a week   Frequency of Social Gatherings with Friends and Family: More than three times a week   Attends Religious Services: Never   Marine scientist or Organizations: No   Attends Music therapist: Never   Marital Status: Living with partner    Tobacco Counseling Counseling given: No   Clinical Intake:  Pre-visit preparation completed: Yes  Pain : No/denies pain     BMI - recorded: 29.35 Nutritional Status: BMI 25 -29 Overweight Nutritional Risks: None Diabetes: No  How often do you need to have someone help you when you read instructions, pamphlets, or other written materials from your doctor or pharmacy?: 1 - Never   Interpreter Needed?: No  Information entered by :: Clemetine Marker LPN   Activities of Daily Living In your present state of health, do you have any difficulty performing the following activities: 02/14/2021  Hearing? Y  Comment has hearing aids but does not wear them often  Vision? N  Difficulty concentrating or making decisions? N  Walking or climbing stairs? N  Dressing or bathing? N  Doing errands, shopping? N  Preparing Food and eating ? N  Using the Toilet? N  In the past six months, have you accidently leaked urine? N  Do you have problems with loss of bowel control? N  Managing your Medications? N  Managing your Finances? N  Housekeeping or managing your Housekeeping? N  Some recent data might be hidden    Patient Care Team: Juline Patch, MD as PCP - General (Family Medicine) Jannet Mantis, MD (Dermatology)  Indicate any recent Medical Services you may have received from other than [facility] providers in the past year (date may be approximate).     Assessment:   This is a routine wellness examination for  Angel Cummings.  Hearing/Vision screen Hearing Screening - Comments:: Pt has hearing aids but does not wear them often  Vision Screening - Comments:: Past due for eye exam; declines referral  Dietary issues and exercise activities discussed: Current Exercise Habits: The patient has a physically strenuous job, but has no regular exercise apart from work., Exercise limited by: None identified   Goals Addressed   None    Depression Screen PHQ 2/9 Scores 02/14/2021 10/06/2020 04/19/2020 02/11/2020 11/27/2019 11/24/2019 11/05/2019  PHQ - 2 Score 0 0 0 0 0 1 0  PHQ- 9 Score 2 0 0 - 0 3 6    Fall Risk Fall Risk  02/14/2021 04/19/2020 02/11/2020 11/27/2019  11/24/2019  Falls in the past year? 1 0 0 0 0  Number falls in past yr: 0 - 0 - -  Injury with Fall? 0 - 0 - -  Risk for fall due to : History of fall(s) - No Fall Risks - -  Follow up Falls prevention discussed Falls evaluation completed Falls prevention discussed Falls evaluation completed Falls evaluation completed    [place]:  Any stairs in or around the home? Yes  If so, are there any without handrails? No  Home free of loose throw rugs in walkways, pet beds, electrical cords, etc? Yes  Adequate lighting in your home to reduce risk of falls? Yes   ASSISTIVE DEVICES UTILIZED TO PREVENT FALLS:  Life alert? No  Use of a cane, walker or w/c? No  Grab bars in the bathroom? No  Shower chair or bench in shower? No  Elevated toilet seat or a handicapped toilet? No   TIMED UP AND GO:  Was the test performed? Yes .  Length of time to ambulate 10 feet: 5 sec.   Gait steady and fast without use of assistive device  Cognitive Function: Normal cognitive status assessed by direct observation by this Nurse Health Advisor. No abnormalities found.          Immunizations Immunization History  Administered Date(s) Administered   Pneumococcal Conjugate-13 06/01/2015   Pneumococcal Polysaccharide-23 05/17/2017    Tdap 06/01/2015, 12/19/2016    TDAP status: Up to date  Flu Vaccine status: Declined, Education has been provided regarding the importance of this vaccine but patient still declined. Advised may receive this vaccine at local pharmacy or Health Dept. Aware to provide a copy of the vaccination record if obtained from local pharmacy or Health Dept. Verbalized acceptance and understanding.  Pneumococcal vaccine status: Up to date  Covid-19 vaccine status: Declined, Education has been provided regarding the importance of this vaccine but patient still declined. Advised may receive this vaccine at local pharmacy or Health Dept.or vaccine clinic. Aware to provide a copy of the vaccination record if obtained from local pharmacy or Health Dept. Verbalized acceptance and understanding.  Qualifies for Shingles Vaccine? Yes   Zostavax completed No   Shingrix Completed?: No.    Education has been provided regarding the importance of this vaccine. Patient has been advised to call insurance company to determine out of pocket expense if they have not yet received this vaccine. Advised may also receive vaccine at local pharmacy or Health Dept. Verbalized acceptance and understanding.  Screening Tests Health Maintenance  Topic Date Due   Zoster Vaccines- Shingrix (1 of 2) Never done   Hepatitis C Screening  04/19/2021 (Originally 01/22/1964)   TETANUS/TDAP  12/20/2026   COLONOSCOPY (Pts 45-24yrs Insurance coverage will need to be confirmed)  12/17/2029   HPV VACCINES  Aged Out   INFLUENZA VACCINE  Discontinued   COVID-19 Vaccine  Discontinued    Health Maintenance  Health Maintenance Due  Topic Date Due   Zoster Vaccines- Shingrix (1 of 2) Never done    Colorectal cancer screening: Type of screening: Colonoscopy. Completed 12/18/19. Repeat every 10 years. No longer required.   Lung Cancer Screening: (Low Dose CT Chest recommended if Age 60-80 years, 30 pack-year currently smoking OR have quit w/in  15years.) does not qualify.   Additional Screening:  Hepatitis C Screening: does qualify; postponed  Vision Screening: Recommended annual ophthalmology exams for early detection of glaucoma and other disorders of the eye. Is  the patient up to date with their annual eye exam?  No  Who is the provider or what is the name of the office in which the patient attends annual eye exams? Not established If pt is not established with a provider, would they like to be referred to a provider to establish care? No .   Dental Screening: Recommended annual dental exams for proper oral hygiene  Community Resource Referral / Chronic Care Management: CRR required this visit?  No   CCM required this visit?  No      Plan:     I have personally reviewed and noted the following in the patient's chart:   Medical and social history Use of alcohol, tobacco or illicit drugs  Current medications and supplements including opioid prescriptions. Patient is not currently taking opioid prescriptions. Functional ability and status Nutritional status Physical activity Advanced directives List of other physicians Hospitalizations, surgeries, and ER visits in previous 12 months Vitals Screenings to include cognitive, depression, and falls Referrals and appointments  In addition, I have reviewed and discussed with patient certain preventive protocols, quality metrics, and best practice recommendations. A written personalized care plan for preventive services as well as general preventive health recommendations were provided to patient.     Clemetine Marker, LPN   [id number]   Nurse Notes: none

## 2021-02-14 NOTE — Patient Instructions (Signed)
[name] , Thank you for taking time to come for your Medicare Wellness Visit. I appreciate your ongoing commitment to your health goals. Please review the following plan we discussed and let me know if I can assist you in the future.   Screening recommendations/referrals: Colonoscopy: done 12/18/19 Recommended yearly ophthalmology/optometry visit for glaucoma screening and checkup Recommended yearly dental visit for hygiene and checkup  Vaccinations: Influenza vaccine: declined Pneumococcal vaccine: done 05/17/17 Tdap vaccine: done 12/19/16 Shingles vaccine: Shingrix discussed. Please contact your pharmacy for coverage information.  Covid-19: declined  Advanced directives: Advance directive discussed with you today. Even though you declined this today please call our office should you change your mind and we can give you the proper paperwork for you to fill out.   Conditions/risks identified: Keep up the great work!  Next appointment: Follow up in one year for your annual wellness visit.   Preventive Care 23 Years and Older, Male Preventive care refers to lifestyle choices and visits with your health care provider that can promote health and wellness. What does preventive care include? A yearly physical exam. This is also called an annual well check. Dental exams once or twice a year. Routine eye exams. Ask your health care provider how often you should have your eyes checked. Personal lifestyle choices, including: Daily care of your teeth and gums. Regular physical activity. Eating a healthy diet. Avoiding tobacco and drug use. Limiting alcohol use. Practicing safe sex. Taking low doses of aspirin every day. Taking vitamin and mineral supplements as recommended by your health care provider. What happens during an annual well check? The services and screenings done by your health care provider during your annual well check will depend on your age, overall health, lifestyle risk  factors, and family history of disease. Counseling  Your health care provider may ask you questions about your: Alcohol use. Tobacco use. Drug use. Emotional well-being. Home and relationship well-being. Sexual activity. Eating habits. History of falls. Memory and ability to understand (cognition). Work and work Statistician. Screening  You may have the following tests or measurements: Height, weight, and BMI. Blood pressure. Lipid and cholesterol levels. These may be checked every 5 years, or more frequently if you are over 73 years old. Skin check. Lung cancer screening. You may have this screening every year starting at age 73 if you have a 30-pack-year history of smoking and currently smoke or have quit within the past 15 years. Fecal occult blood test (FOBT) of the stool. You may have this test every year starting at age 73. Flexible sigmoidoscopy or colonoscopy. You may have a sigmoidoscopy every 5 years or a colonoscopy every 10 years starting at age 73. Prostate cancer screening. Recommendations will vary depending on your family history and other risks. Hepatitis C blood test. Hepatitis B blood test. Sexually transmitted disease (STD) testing. Diabetes screening. This is done by checking your blood sugar (glucose) after you have not eaten for a while (fasting). You may have this done every 1-3 years. Abdominal aortic aneurysm (AAA) screening. You may need this if you are a current or former smoker. Osteoporosis. You may be screened starting at age 73 if you are at high risk. Talk with your health care provider about your test results, treatment options, and if necessary, the need for more tests. Vaccines  Your health care provider may recommend certain vaccines, such as: Influenza vaccine. This is recommended every year. Tetanus, diphtheria, and acellular pertussis (Tdap, Td) vaccine. You may need a Td  booster every 10 years. Zoster vaccine. You may need this after age  73. Pneumococcal 13-valent conjugate (PCV13) vaccine. One dose is recommended after age 73. Pneumococcal polysaccharide (PPSV23) vaccine. One dose is recommended after age 73. Talk to your health care provider about which screenings and vaccines you need and how often you need them. This information is not intended to replace advice given to you by your health care provider. Make sure you discuss any questions you have with your health care provider. Document Released: 05/21/2015 Document Revised: 01/12/2016 Document Reviewed: 02/23/2015 Elsevier Interactive Patient Education  2017 [place] Prevention in the Home Falls can cause injuries. They can happen to people of all ages. There are many things you can do to make your home safe and to help prevent falls. What can I do on the outside of my home? Regularly fix the edges of walkways and driveways and fix any cracks. Remove anything that might make you trip as you walk through a door, such as a raised step or threshold. Trim any bushes or trees on the path to your home. Use bright outdoor lighting. Clear any walking paths of anything that might make someone trip, such as rocks or tools. Regularly check to see if handrails are loose or broken. Make sure that both sides of any steps have handrails. Any raised decks and porches should have guardrails on the edges. Have any leaves, snow, or ice cleared regularly. Use sand or salt on walking paths during winter. Clean up any spills in your garage right away. This includes oil or grease spills. What can I do in the bathroom? Use night lights. Install grab bars by the toilet and in the tub and shower. Do not use towel bars as grab bars. Use non-skid mats or decals in the tub or shower. If you need to sit down in the shower, use a plastic, non-slip stool. Keep the floor dry. Clean up any water that spills on the floor as soon as it happens. Remove soap buildup in the tub or shower  regularly. Attach bath mats securely with double-sided non-slip rug tape. Do not have throw rugs and other things on the floor that can make you trip. What can I do in the bedroom? Use night lights. Make sure that you have a light by your bed that is easy to reach. Do not use any sheets or blankets that are too big for your bed. They should not hang down onto the floor. Have a firm chair that has side arms. You can use this for support while you get dressed. Do not have throw rugs and other things on the floor that can make you trip. What can I do in the kitchen? Clean up any spills right away. Avoid walking on wet floors. Keep items that you use a lot in easy-to-reach places. If you need to reach something above you, use a strong step stool that has a grab bar. Keep electrical cords out of the way. Do not use floor polish or wax that makes floors slippery. If you must use wax, use non-skid floor wax. Do not have throw rugs and other things on the floor that can make you trip. What can I do with my stairs? Do not leave any items on the stairs. Make sure that there are handrails on both sides of the stairs and use them. Fix handrails that are broken or loose. Make sure that handrails are as long as the stairways. Check any carpeting  to make sure that it is firmly attached to the stairs. Fix any carpet that is loose or worn. Avoid having throw rugs at the top or bottom of the stairs. If you do have throw rugs, attach them to the floor with carpet tape. Make sure that you have a light switch at the top of the stairs and the bottom of the stairs. If you do not have them, ask someone to add them for you. What else can I do to help prevent falls? Wear shoes that: Do not have high heels. Have rubber bottoms. Are comfortable and fit you well. Are closed at the toe. Do not wear sandals. If you use a stepladder: Make sure that it is fully opened. Do not climb a closed stepladder. Make sure that  both sides of the stepladder are locked into place. Ask someone to hold it for you, if possible. Clearly mark and make sure that you can see: Any grab bars or handrails. First and last steps. Where the edge of each step is. Use tools that help you move around (mobility aids) if they are needed. These include: Canes. Walkers. Scooters. Crutches. Turn on the lights when you go into a dark area. Replace any light bulbs as soon as they burn out. Set up your furniture so you have a clear path. Avoid moving your furniture around. If any of your floors are uneven, fix them. If there are any pets around you, be aware of where they are. Review your medicines with your doctor. Some medicines can make you feel dizzy. This can increase your chance of falling. Ask your doctor what other things that you can do to help prevent falls. This information is not intended to replace advice given to you by your health care provider. Make sure you discuss any questions you have with your health care provider. Document Released: 02/18/2009 Document Revised: 09/30/2015 Document Reviewed: 05/29/2014 Elsevier Interactive Patient Education  2017 [employer].

## 2021-04-12 DIAGNOSIS — G8929 Other chronic pain: Secondary | ICD-10-CM | POA: Diagnosis not present

## 2021-04-12 DIAGNOSIS — M1712 Unilateral primary osteoarthritis, left knee: Secondary | ICD-10-CM | POA: Diagnosis not present

## 2021-04-12 DIAGNOSIS — M25562 Pain in left knee: Secondary | ICD-10-CM | POA: Diagnosis not present

## 2021-04-27 DIAGNOSIS — D485 Neoplasm of uncertain behavior of skin: Secondary | ICD-10-CM | POA: Diagnosis not present

## 2021-04-27 DIAGNOSIS — C4442 Squamous cell carcinoma of skin of scalp and neck: Secondary | ICD-10-CM | POA: Diagnosis not present

## 2021-05-10 ENCOUNTER — Encounter: Payer: Self-pay | Admitting: Family Medicine

## 2021-05-10 ENCOUNTER — Other Ambulatory Visit: Payer: Self-pay

## 2021-05-10 ENCOUNTER — Ambulatory Visit (INDEPENDENT_AMBULATORY_CARE_PROVIDER_SITE_OTHER): Payer: Medicare HMO | Admitting: Family Medicine

## 2021-05-10 VITALS — BP 118/82 | HR 78 | Ht 72.0 in | Wt 218.0 lb

## 2021-05-10 DIAGNOSIS — E782 Mixed hyperlipidemia: Secondary | ICD-10-CM | POA: Diagnosis not present

## 2021-05-10 DIAGNOSIS — I10 Essential (primary) hypertension: Secondary | ICD-10-CM

## 2021-05-10 MED ORDER — LOSARTAN POTASSIUM 50 MG PO TABS: 50.0000 mg | ORAL_TABLET | Freq: Every day | ORAL | 1 refills | Status: DC

## 2021-05-10 MED ORDER — AMLODIPINE BESYLATE 10 MG PO TABS: 10.0000 mg | ORAL_TABLET | Freq: Every day | ORAL | 1 refills | Status: DC

## 2021-05-10 MED ORDER — ROSUVASTATIN CALCIUM 10 MG PO TABS: 10.0000 mg | ORAL_TABLET | ORAL | 1 refills | Status: DC

## 2021-05-10 NOTE — Progress Notes (Signed)
Date:  05/10/2021   Name:  Angel Cummings.   DOB:  Jun 13, 1945   MRN:  [id number]   Chief Complaint: Hypertension and Hyperlipidemia  Hypertension This is a chronic problem. The current episode started more than 1 year ago. The problem has been gradually improving since onset. The problem is controlled. Pertinent negatives include no anxiety, blurred vision, chest pain, headaches, malaise/fatigue, neck pain, orthopnea, palpitations, peripheral edema, PND, shortness of breath or sweats. There are no associated agents to hypertension. Risk factors for coronary artery disease include dyslipidemia, male gender and post-menopausal state. Past treatments include angiotensin blockers and calcium channel blockers. The current treatment provides moderate improvement. There are no compliance problems.  There is no history of angina, kidney disease, CAD/MI, CVA, heart failure, left ventricular hypertrophy, PVD or retinopathy. There is no history of chronic renal disease, a hypertension causing med or renovascular disease.  Hyperlipidemia This is a chronic problem. The current episode started more than 1 year ago. The problem is controlled. Recent lipid tests were reviewed and are normal. He has no history of chronic renal disease, diabetes, hypothyroidism, liver disease, obesity or nephrotic syndrome. Pertinent negatives include no chest pain, focal sensory loss, focal weakness, leg pain, myalgias or shortness of breath. Current antihyperlipidemic treatment includes statins. The current treatment provides moderate improvement of lipids. There are no compliance problems.  Risk factors for coronary artery disease include dyslipidemia and hypertension.   Lab Results  Component Value Date   NA 143 10/06/2020   K 3.5 10/06/2020   CO2 23 10/06/2020   GLUCOSE 105 (H) 10/06/2020   BUN 9 10/06/2020   CREATININE 0.94 10/06/2020   CALCIUM 9.0 10/06/2020   EGFR 86 10/06/2020   GFRNONAA 72 04/19/2020   Lab Results   Component Value Date   CHOL 173 10/06/2020   HDL 42 10/06/2020   LDLCALC 109 (H) 10/06/2020   TRIG 125 10/06/2020   CHOLHDL 5.0 06/25/2017   No results found for: TSH No results found for: HGBA1C Lab Results  Component Value Date   WBC 3.2 (L) 11/27/2019   HGB 13.4 11/27/2019   HCT 38.5 (L) 11/27/2019   MCV 87.7 11/27/2019   PLT 216 11/27/2019   No results found for: ALT, AST, GGT, ALKPHOS, BILITOT No results found for: 25OHVITD2, 25OHVITD3, VD25OH   Review of Systems  Constitutional:  Negative for chills, fever and malaise/fatigue.  HENT:  Negative for drooling, ear discharge, ear pain and sore throat.   Eyes:  Negative for blurred vision.  Respiratory:  Negative for cough, shortness of breath and wheezing.   Cardiovascular:  Negative for chest pain, palpitations, orthopnea, leg swelling and PND.  Gastrointestinal:  Negative for abdominal pain, blood in stool, constipation, diarrhea and nausea.  Endocrine: Negative for polydipsia.  Genitourinary:  Negative for dysuria, frequency, hematuria and urgency.  Musculoskeletal:  Negative for back pain, myalgias and neck pain.  Skin:  Negative for rash.  Allergic/Immunologic: Negative for environmental allergies.  Neurological:  Negative for dizziness, focal weakness and headaches.  Hematological:  Does not bruise/bleed easily.  Psychiatric/Behavioral:  Negative for suicidal ideas. The patient is not nervous/anxious.    Patient Active Problem List   Diagnosis Date Noted   Screen for colon cancer    Cubital tunnel syndrome on right 07/18/2017   Radiculopathy of cervical region 07/18/2017   Acute postoperative pain 12/21/2016   Open fracture of shaft of right ulna 12/20/2016   Arthritis of both hands 04/17/2016   Arthritis of  hand 04/17/2016   Essential hypertension 06/01/2015   Hyperlipidemia 06/01/2015    Allergies  Allergen Reactions   Amoxicillin Rash   Etodolac Rash    Past Surgical History:  Procedure Laterality  Date   COLONOSCOPY  2005   Dr Vickki Muff in [place] N/A 12/18/2019   Procedure: COLONOSCOPY WITH PROPOFOL;  Surgeon: Lucilla Lame, MD;  Location: [place];  Service: Endoscopy;  Laterality: N/A;   HERNIA REPAIR     KNEE SURGERY Left     Social History   Tobacco Use   Smoking status: Never   Smokeless tobacco: Never  Vaping Use   Vaping Use: Never used  Substance Use Topics   Alcohol use: Yes    Alcohol/week: 2.0 standard drinks    Types: 2 Cans of beer per week    Comment: 2-3 times per week   Drug use: No     Medication list has been reviewed and updated.  Current Meds  Medication Sig   acetaminophen (TYLENOL) 650 MG CR tablet Take 1,300 mg by mouth every 8 (eight) hours as needed for pain.   amLODipine (NORVASC) 10 MG tablet Take 1 tablet (10 mg total) by mouth daily.   aspirin 81 MG tablet Take 1 tablet (81 mg total) by mouth daily.   Ca Carbonate-Mag Hydroxide (ROLAIDS PO) Take by mouth as needed.   cetirizine (ZYRTEC) 10 MG tablet Take 10 mg by mouth at bedtime.   Cyanocobalamin (VITAMIN B-12 PO) Take by mouth daily.   diphenhydrAMINE (BENADRYL) 25 MG tablet Take 25 mg by mouth every 6 (six) hours as needed.   loratadine (CLARITIN) 10 MG tablet Take 1 tablet (10 mg total) by mouth daily.   losartan (COZAAR) 50 MG tablet Take 1 tablet (50 mg total) by mouth daily.   rosuvastatin (CRESTOR) 10 MG tablet Take 1 tablet (10 mg total) by mouth once a week.    PHQ 2/9 Scores 05/10/2021 02/14/2021 10/06/2020 04/19/2020  PHQ - 2 Score 0 0 0 0  PHQ- 9 Score 5 2 0 0    GAD 7 : Generalized Anxiety Score 05/10/2021 10/06/2020 04/19/2020 11/27/2019  Nervous, Anxious, on Edge 0 0 0 0  Control/stop worrying 0 0 0 0  Worry too much - different things 0 0 0 0  Trouble relaxing 0 0 0 0  Restless 0 0 0 0  Easily annoyed or irritable 3 0 0 0  Afraid - awful might happen 0 0 0 0  Total GAD 7 Score 3 0 0 0  Anxiety Difficulty Not difficult at all - - -     BP Readings from Last 3 Encounters:  05/10/21 118/82  02/14/21 (!) 148/90  11/04/20 138/70    Physical Exam Vitals and nursing note reviewed.  HENT:     Head: Normocephalic.     Right Ear: Tympanic membrane and external ear normal.     Left Ear: Tympanic membrane and external ear normal.     Nose: Nose normal. No congestion or rhinorrhea.  Eyes:     General: No scleral icterus.       Right eye: No discharge.        Left eye: No discharge.     Conjunctiva/sclera: Conjunctivae normal.     Pupils: Pupils are equal, round, and reactive to light.  Neck:     Thyroid: No thyromegaly.     Vascular: No JVD.     Trachea: No tracheal deviation.  Cardiovascular:  Rate and Rhythm: Normal rate and regular rhythm.     Pulses:          Carotid pulses are 2+ on the right side and 2+ on the left side.      Radial pulses are 2+ on the right side and 2+ on the left side.       Femoral pulses are 2+ on the right side and 2+ on the left side.      Popliteal pulses are 2+ on the right side and 2+ on the left side.       Dorsalis pedis pulses are 2+ on the right side and 2+ on the left side.       Posterior tibial pulses are 2+ on the right side and 2+ on the left side.     Heart sounds: Normal heart sounds, S1 normal and S2 normal. No murmur heard. No systolic murmur is present.  No diastolic murmur is present.    No friction rub. No gallop. No S3 or S4 sounds.  Pulmonary:     Effort: No respiratory distress.     Breath sounds: Normal breath sounds. No wheezing, rhonchi or rales.  Chest:     Chest wall: No tenderness.  Abdominal:     General: Bowel sounds are normal.     Palpations: Abdomen is soft. There is no mass.     Tenderness: There is no abdominal tenderness. There is no guarding or rebound.  Musculoskeletal:        General: No tenderness. Normal range of motion.     Cervical back: Normal range of motion and neck supple.     Right lower leg: No edema.     Left lower leg: No  edema.  Lymphadenopathy:     Cervical: No cervical adenopathy.  Skin:    General: Skin is warm.     Findings: No rash.  Neurological:     Mental Status: He is alert and oriented to person, place, and time.     Cranial Nerves: No cranial nerve deficit.     Deep Tendon Reflexes: Reflexes are normal and symmetric.    Wt Readings from Last 3 Encounters:  05/10/21 218 lb (98.9 kg)  02/14/21 216 lb 6.4 oz (98.2 kg)  11/04/20 212 lb (96.2 kg)    BP 118/82    Pulse 78    Ht 6' (1.829 m)    Wt 218 lb (98.9 kg)    BMI 29.57 kg/m   Assessment and Plan:  1. Essential hypertension Chronic.  Controlled.  Stable.  Course of disease is improving.  Blood pressure today continues to be good at 118/82.  We will continue amlodipine 10 mg once a day and losartan 50 mg once a day.  Will check CMP for electrolytes and GFR.  We will recheck patient in 6 months. - amLODipine (NORVASC) 10 MG tablet; Take 1 tablet (10 mg total) by mouth daily.  Dispense: 90 tablet; Refill: 1 - losartan (COZAAR) 50 MG tablet; Take 1 tablet (50 mg total) by mouth daily.  Dispense: 90 tablet; Refill: 1 - Comprehensive Metabolic Panel (CMET)  2. Mixed hyperlipidemia Chronic.  Controlled.  Stable.  Previous lipid panel elevated to 109.  Patient is fasting and we will check LDL status with lipid panel.  We will continue rosuvastatin 10 mg once a week. - rosuvastatin (CRESTOR) 10 MG tablet; Take 1 tablet (10 mg total) by mouth once a week.  Dispense: 12 tablet; Refill: 1 - Lipid Panel With  LDL/HDL Ratio

## 2021-05-11 LAB — LIPID PANEL WITH LDL/HDL RATIO
Cholesterol, Total: 221 mg/dL — ABNORMAL HIGH (ref 100–199)
HDL: 40 mg/dL (ref >39)
LDL Chol Calc (NIH): 145 mg/dL — ABNORMAL HIGH (ref 0–99)
LDL/HDL Ratio: 3.6 ratio (ref 0.0–3.6)
Triglycerides: 197 mg/dL — ABNORMAL HIGH (ref 0–149)
VLDL Cholesterol Cal: 36 mg/dL (ref 5–40)

## 2021-05-11 LAB — COMPREHENSIVE METABOLIC PANEL
ALT: 17 IU/L (ref 0–44)
AST: 14 IU/L (ref 0–40)
Albumin/Globulin Ratio: 2.3 — ABNORMAL HIGH (ref 1.2–2.2)
Albumin: 4.5 g/dL (ref 3.7–4.7)
Alkaline Phosphatase: 86 IU/L (ref 44–121)
BUN/Creatinine Ratio: 14 (ref 10–24)
BUN: 15 mg/dL (ref 8–27)
Bilirubin Total: 0.3 mg/dL (ref 0.0–1.2)
CO2: 24 mmol/L (ref 20–29)
Calcium: 9.3 mg/dL (ref 8.6–10.2)
Chloride: 106 mmol/L (ref 96–106)
Creatinine, Ser: 1.1 mg/dL (ref 0.76–1.27)
Globulin, Total: 2 g/dL (ref 1.5–4.5)
Glucose: 103 mg/dL — ABNORMAL HIGH (ref 70–99)
Potassium: 4.2 mmol/L (ref 3.5–5.2)
Sodium: 144 mmol/L (ref 134–144)
Total Protein: 6.5 g/dL (ref 6.0–8.5)
eGFR: 71 mL/min/{1.73_m2} (ref >59)

## 2021-06-09 DIAGNOSIS — C4442 Squamous cell carcinoma of skin of scalp and neck: Secondary | ICD-10-CM | POA: Diagnosis not present

## 2021-06-09 DIAGNOSIS — L988 Other specified disorders of the skin and subcutaneous tissue: Secondary | ICD-10-CM | POA: Diagnosis not present

## 2021-10-25 ENCOUNTER — Other Ambulatory Visit: Payer: Self-pay | Admitting: Family Medicine

## 2021-10-25 DIAGNOSIS — L282 Other prurigo: Secondary | ICD-10-CM

## 2021-10-25 NOTE — Telephone Encounter (Signed)
Requested Prescriptions  Pending Prescriptions Disp Refills  . loratadine (CLARITIN) 10 MG tablet [Pharmacy Med Name: LORATADINE 10 MG TAB] 30 tablet 7    Sig: TAKE 1 TABLET BY MOUTH DAILY     Ear, Nose, and Throat:  Antihistamines 2 Passed - 10/25/2021 10:26 AM      Passed - Cr in normal range and within 360 days    Creatinine, Ser  Date Value Ref Range Status  05/10/2021 1.10 0.76 - 1.27 mg/dL Final         Passed - Valid encounter within last 12 months    Recent Outpatient Visits          5 months ago Essential hypertension   [facility], Deanna C, MD   11 months ago Essential hypertension   [facility] Juline Patch, MD   1 year ago Essential hypertension   [facility] Juline Patch, MD   1 year ago Essential hypertension   [facility] Juline Patch, MD   1 year ago Diarrhea, unspecified type   [facility] Juline Patch, MD

## 2021-11-07 ENCOUNTER — Ambulatory Visit: Payer: Medicare HMO | Admitting: Family Medicine

## 2021-11-11 ENCOUNTER — Other Ambulatory Visit: Payer: Self-pay | Admitting: Family Medicine

## 2021-11-11 DIAGNOSIS — E782 Mixed hyperlipidemia: Secondary | ICD-10-CM

## 2021-11-15 ENCOUNTER — Encounter: Payer: Self-pay | Admitting: Family Medicine

## 2021-11-15 ENCOUNTER — Ambulatory Visit (INDEPENDENT_AMBULATORY_CARE_PROVIDER_SITE_OTHER): Payer: Medicare HMO | Admitting: Family Medicine

## 2021-11-15 VITALS — BP 130/84 | HR 80 | Ht 72.0 in | Wt 219.0 lb

## 2021-11-15 DIAGNOSIS — I10 Essential (primary) hypertension: Secondary | ICD-10-CM

## 2021-11-15 DIAGNOSIS — R5383 Other fatigue: Secondary | ICD-10-CM

## 2021-11-15 DIAGNOSIS — J301 Allergic rhinitis due to pollen: Secondary | ICD-10-CM

## 2021-11-15 DIAGNOSIS — E782 Mixed hyperlipidemia: Secondary | ICD-10-CM | POA: Diagnosis not present

## 2021-11-15 MED ORDER — AMLODIPINE BESYLATE 10 MG PO TABS: 10.0000 mg | ORAL_TABLET | Freq: Every day | ORAL | 1 refills | Status: DC

## 2021-11-15 MED ORDER — LOSARTAN POTASSIUM 50 MG PO TABS: 50.0000 mg | ORAL_TABLET | Freq: Every day | ORAL | 1 refills | Status: DC

## 2021-11-15 MED ORDER — TRIAMCINOLONE ACETONIDE 55 MCG/ACT NA AERO: 2.0000 | INHALATION_SPRAY | Freq: Every day | NASAL | 12 refills | Status: AC

## 2021-11-15 MED ORDER — ROSUVASTATIN CALCIUM 10 MG PO TABS: 10.0000 mg | ORAL_TABLET | ORAL | 1 refills | Status: DC

## 2021-11-15 MED ORDER — MONTELUKAST SODIUM 10 MG PO TABS: 10.0000 mg | ORAL_TABLET | Freq: Every day | ORAL | 3 refills | Status: DC

## 2021-11-15 NOTE — Progress Notes (Signed)
Date:  11/15/2021   Name:  Angel Cummings.   DOB:  [birth year]   MRN:  [id number]   Chief Complaint: Hyperlipidemia, Hypertension, and Fatigue (After strenuous activity)  Hyperlipidemia This is a chronic problem. The current episode started more than 1 year ago. The problem is controlled. Recent lipid tests were reviewed and are normal. He has no history of chronic renal disease, diabetes, hypothyroidism, liver disease, obesity or nephrotic syndrome. Pertinent negatives include no chest pain, focal sensory loss, focal weakness, leg pain, myalgias or shortness of breath. Current antihyperlipidemic treatment includes statins. The current treatment provides moderate improvement of lipids. There are no compliance problems.  Risk factors for coronary artery disease include hypertension and dyslipidemia.  Hypertension This is a chronic problem. The current episode started more than 1 year ago. The problem has been gradually improving since onset. The problem is controlled. Pertinent negatives include no anxiety, blurred vision, chest pain, headaches, malaise/fatigue, neck pain, orthopnea, palpitations, peripheral edema, PND, shortness of breath or sweats. Risk factors for coronary artery disease include dyslipidemia. Identifiable causes of hypertension include a thyroid problem. There is no history of chronic renal disease.  Thyroid Problem Presents for follow-up (for fatigue) visit. Symptoms include fatigue and nail problem. Patient reports no cold intolerance, constipation, depressed mood, diaphoresis, diarrhea, dry skin, hair loss, heat intolerance, hoarse voice, leg swelling, palpitations, tremors, visual change, weight gain or weight loss. The symptoms have been stable. His past medical history is significant for hyperlipidemia. There is no history of diabetes.  URI  Chronicity: for allergic rhinitis. Associated symptoms include congestion and coughing. Pertinent negatives include no chest pain,  diarrhea, headaches, nausea, neck pain, rhinorrhea, sinus pain, sneezing, sore throat, vomiting or wheezing.    Lab Results  Component Value Date   NA 144 05/10/2021   K 4.2 05/10/2021   CO2 24 05/10/2021   GLUCOSE 103 (H) 05/10/2021   BUN 15 05/10/2021   CREATININE 1.10 05/10/2021   CALCIUM 9.3 05/10/2021   EGFR 71 05/10/2021   GFRNONAA 72 04/19/2020   Lab Results  Component Value Date   CHOL 221 (H) 05/10/2021   HDL 40 05/10/2021   LDLCALC 145 (H) 05/10/2021   TRIG 197 (H) 05/10/2021   CHOLHDL 5.0 06/25/2017   No results found for: "TSH" No results found for: "HGBA1C" Lab Results  Component Value Date   WBC 3.2 (L) 11/27/2019   HGB 13.4 11/27/2019   HCT 38.5 (L) 11/27/2019   MCV 87.7 11/27/2019   PLT 216 11/27/2019   Lab Results  Component Value Date   ALT 17 05/10/2021   AST 14 05/10/2021   ALKPHOS 86 05/10/2021   BILITOT 0.3 05/10/2021   No results found for: "25OHVITD2", "25OHVITD3", "VD25OH"   Review of Systems  Constitutional:  Positive for fatigue. Negative for diaphoresis, malaise/fatigue, weight gain and weight loss.  HENT:  Positive for congestion. Negative for hoarse voice, rhinorrhea, sinus pain, sneezing and sore throat.   Eyes:  Negative for blurred vision.  Respiratory:  Positive for cough. Negative for shortness of breath and wheezing.   Cardiovascular:  Negative for chest pain, palpitations, orthopnea and PND.  Gastrointestinal:  Negative for constipation, diarrhea, nausea and vomiting.  Endocrine: Negative for cold intolerance and heat intolerance.  Musculoskeletal:  Negative for myalgias and neck pain.  Neurological:  Negative for tremors, focal weakness and headaches.    Patient Active Problem List   Diagnosis Date Noted   Screen for colon cancer    Cubital  tunnel syndrome on right 07/18/2017   Radiculopathy of cervical region 07/18/2017   Acute postoperative pain 12/21/2016   Open fracture of shaft of right ulna 12/20/2016   Arthritis  of both hands 04/17/2016   Arthritis of hand 04/17/2016   Essential hypertension 06/01/2015   Hyperlipidemia 06/01/2015    Allergies  Allergen Reactions   Amoxicillin Rash   Etodolac Rash    Past Surgical History:  Procedure Laterality Date   COLONOSCOPY  2005   Dr Vickki Muff in [place] N/A 12/18/2019   Procedure: COLONOSCOPY WITH PROPOFOL;  Surgeon: Lucilla Lame, MD;  Location: [place];  Service: Endoscopy;  Laterality: N/A;   HERNIA REPAIR     KNEE SURGERY Left     Social History   Tobacco Use   Smoking status: Never   Smokeless tobacco: Never  Vaping Use   Vaping Use: Never used  Substance Use Topics   Alcohol use: Yes    Alcohol/week: 2.0 standard drinks of alcohol    Types: 2 Cans of beer per week    Comment: 2-3 times per week   Drug use: No     Medication list has been reviewed and updated.  Current Meds  Medication Sig   acetaminophen (TYLENOL) 650 MG CR tablet Take 1,300 mg by mouth every 8 (eight) hours as needed for pain.   amLODipine (NORVASC) 10 MG tablet Take 1 tablet (10 mg total) by mouth daily.   aspirin 81 MG tablet Take 1 tablet (81 mg total) by mouth daily.   Ca Carbonate-Mag Hydroxide (ROLAIDS PO) Take by mouth as needed.   cetirizine (ZYRTEC) 10 MG tablet Take 10 mg by mouth at bedtime.   Cyanocobalamin (VITAMIN B-12 PO) Take by mouth daily.   diphenhydrAMINE (BENADRYL) 25 MG tablet Take 25 mg by mouth every 6 (six) hours as needed.   loratadine (CLARITIN) 10 MG tablet TAKE 1 TABLET BY MOUTH DAILY   losartan (COZAAR) 50 MG tablet Take 1 tablet (50 mg total) by mouth daily.   rosuvastatin (CRESTOR) 10 MG tablet TAKE 1 TABLET BY MOUTH ONCE A WEEK       11/15/2021    1:27 PM 05/10/2021    8:27 AM 10/06/2020    8:12 AM 04/19/2020    8:06 AM  GAD 7 : Generalized Anxiety Score  Nervous, Anxious, on Edge 0 0 0 0  Control/stop worrying 0 0 0 0  Worry too much - different things 0 0 0 0  Trouble relaxing 0 0 0 0   Restless 0 0 0 0  Easily annoyed or irritable 0 3 0 0  Afraid - awful might happen 0 0 0 0  Total GAD 7 Score 0 3 0 0  Anxiety Difficulty Not difficult at all Not difficult at all         11/15/2021    1:27 PM 05/10/2021    8:26 AM 02/14/2021    8:27 AM  Depression screen PHQ 2/9  Decreased Interest 0 0 0  Down, Depressed, Hopeless 0 0 0  PHQ - 2 Score 0 0 0  Altered sleeping 0 2 1  Tired, decreased energy _0 Change in appetite 0 0 0  Feeling bad or failure about yourself  0 0 0  Trouble concentrating 0 0 0  Moving slowly or fidgety/restless 0 0 0  Suicidal thoughts 0 0 0  PHQ-9 Score _1 Difficult doing work/chores Somewhat difficult Not difficult at all  Not difficult at all    BP Readings from Last 3 Encounters:  11/15/21 130/84  05/10/21 118/82  02/14/21 (!) 148/90    Physical Exam Vitals and nursing note reviewed.  HENT:     Head: Normocephalic.     Right Ear: External ear normal.     Left Ear: External ear normal.     Nose: Nose normal.  Eyes:     General: No scleral icterus.       Right eye: No discharge.        Left eye: No discharge.     Conjunctiva/sclera: Conjunctivae normal.     Pupils: Pupils are equal, round, and reactive to light.  Neck:     Thyroid: No thyromegaly.     Vascular: No JVD.     Trachea: No tracheal deviation.  Cardiovascular:     Rate and Rhythm: Normal rate and regular rhythm.     Heart sounds: Normal heart sounds. No murmur heard.    No friction rub. No gallop.  Pulmonary:     Effort: No respiratory distress.     Breath sounds: Normal breath sounds. No wheezing or rales.  Abdominal:     General: Bowel sounds are normal.     Palpations: Abdomen is soft. There is no mass.     Tenderness: There is no abdominal tenderness. There is no guarding or rebound.  Musculoskeletal:        General: No tenderness. Normal range of motion.     Cervical back: Normal range of motion and neck supple.  Lymphadenopathy:     Cervical: No  cervical adenopathy.  Skin:    General: Skin is warm.     Findings: No rash.  Neurological:     Cranial Nerves: No cranial nerve deficit.     Wt Readings from Last 3 Encounters:  11/15/21 219 lb (99.3 kg)  05/10/21 218 lb (98.9 kg)  02/14/21 216 lb 6.4 oz (98.2 kg)    BP 130/84   Pulse 80   Ht 6' (1.829 m)   Wt 219 lb (99.3 kg)   BMI 29.70 kg/m   Assessment and Plan:  1. Essential hypertension Chronic.  Controlled.  Stable.  Blood pressure today is 130/84.  Continue amlodipine 10 mg once a day and losartan 50 mg once a day. - amLODipine (NORVASC) 10 MG tablet; Take 1 tablet (10 mg total) by mouth daily.  Dispense: 90 tablet; Refill: 1 - losartan (COZAAR) 50 MG tablet; Take 1 tablet (50 mg total) by mouth daily.  Dispense: 90 tablet; Refill: 1  2. Mixed hyperlipidemia Chronic.  Controlled.  Stable.  Patient has not tolerated statins in the past and currently is only on 10 mg once a week. - Lipid Panel With LDL/HDL Ratio - rosuvastatin (CRESTOR) 10 MG tablet; Take 1 tablet (10 mg total) by mouth once a week.  Dispense: 12 tablet; Refill: 1  3. Non-seasonal allergic rhinitis due to pollen Patient has postnasal drainage which is unrelenting.  This is chronic.  Persistent.  Waxes and wanes depending on the weather conditions.  We will initiate Singulair 10 mg once a day and Nasacort since he is did not notice any change in the Flonase to see if this is better.  I also suggested that he stop taking Zyrtec and switch over to loratadine which is less sedating. - montelukast (SINGULAIR) 10 MG tablet; Take 1 tablet (10 mg total) by mouth at bedtime.  Dispense: 30 tablet; Refill: 3 - triamcinolone (NASACORT) 55 MCG/ACT  AERO nasal inhaler; Place 2 sprays into the nose daily.  Dispense: 1 each; Refill: 12  4. Fatigue, unspecified type New onset.  Persistent.  Patient complains of "giving out "which does not involve chest discomfort, shortness of breath, or muscle fatigue.  However patient  does have a stooped over and gives out [place] and that he can only walk in a kyphotic position and his legs work better under the circumstances which may suggest that there may be some spinal stenosis.  We will initiate fatigue work-up with a TSH CBC CMP initially.  And will have patient return in 4 to 6 weeks for reevaluation and pending symptomatology either pursuing in a cardiac/orthopedic/or pulmonic direction. - TSH - CBC with Differential/Platelet - Comprehensive metabolic panel

## 2021-11-15 NOTE — Patient Instructions (Signed)

## 2021-11-16 ENCOUNTER — Other Ambulatory Visit: Payer: Self-pay

## 2021-11-16 DIAGNOSIS — M7989 Other specified soft tissue disorders: Secondary | ICD-10-CM

## 2021-11-16 LAB — LIPID PANEL WITH LDL/HDL RATIO
Cholesterol, Total: 211 mg/dL — ABNORMAL HIGH (ref 100–199)
HDL: 38 mg/dL — ABNORMAL LOW (ref >39)
LDL Chol Calc (NIH): 128 mg/dL — ABNORMAL HIGH (ref 0–99)
LDL/HDL Ratio: 3.4 ratio (ref 0.0–3.6)
Triglycerides: 254 mg/dL — ABNORMAL HIGH (ref 0–149)
VLDL Cholesterol Cal: 45 mg/dL — ABNORMAL HIGH (ref 5–40)

## 2021-11-16 LAB — COMPREHENSIVE METABOLIC PANEL
ALT: 16 IU/L (ref 0–44)
AST: 14 IU/L (ref 0–40)
Albumin/Globulin Ratio: 1.9 (ref 1.2–2.2)
Albumin: 4.4 g/dL (ref 3.8–4.8)
Alkaline Phosphatase: 72 IU/L (ref 44–121)
BUN/Creatinine Ratio: 13 (ref 10–24)
BUN: 13 mg/dL (ref 8–27)
Bilirubin Total: 0.4 mg/dL (ref 0.0–1.2)
CO2: 22 mmol/L (ref 20–29)
Calcium: 9.3 mg/dL (ref 8.6–10.2)
Chloride: 104 mmol/L (ref 96–106)
Creatinine, Ser: 1.01 mg/dL (ref 0.76–1.27)
Globulin, Total: 2.3 g/dL (ref 1.5–4.5)
Glucose: 88 mg/dL (ref 70–99)
Potassium: 4 mmol/L (ref 3.5–5.2)
Sodium: 139 mmol/L (ref 134–144)
Total Protein: 6.7 g/dL (ref 6.0–8.5)
eGFR: 79 mL/min/{1.73_m2} (ref >59)

## 2021-11-16 LAB — CBC WITH DIFFERENTIAL/PLATELET
Basophils Absolute: 0 10*3/uL (ref 0.0–0.2)
Basos: 1 %
EOS (ABSOLUTE): 0.3 10*3/uL (ref 0.0–0.4)
Eos: 6 %
Hematocrit: 41 % (ref 37.5–51.0)
Hemoglobin: 14 g/dL (ref 13.0–17.7)
Immature Grans (Abs): 0 10*3/uL (ref 0.0–0.1)
Immature Granulocytes: 0 %
Lymphocytes Absolute: 1.2 10*3/uL (ref 0.7–3.1)
Lymphs: 24 %
MCH: 30.7 pg (ref 26.6–33.0)
MCHC: 34.1 g/dL (ref 31.5–35.7)
MCV: 90 fL (ref 79–97)
Monocytes Absolute: 0.5 10*3/uL (ref 0.1–0.9)
Monocytes: 10 %
Neutrophils Absolute: 2.8 10*3/uL (ref 1.4–7.0)
Neutrophils: 59 %
Platelets: 208 10*3/uL (ref 150–450)
RBC: 4.56 x10E6/uL (ref 4.14–5.80)
RDW: 13.2 % (ref 11.6–15.4)
WBC: 4.7 10*3/uL (ref 3.4–10.8)

## 2021-11-16 LAB — TSH: TSH: 1.36 u[IU]/mL (ref 0.450–4.500)

## 2021-11-16 NOTE — Progress Notes (Signed)
Placed ref to [place]

## 2021-11-28 ENCOUNTER — Ambulatory Visit: Payer: Self-pay

## 2021-11-28 ENCOUNTER — Telehealth: Payer: Self-pay

## 2021-11-28 NOTE — Telephone Encounter (Signed)
Spoke to pt- advised to get back in with Dr Phillip Heal for active rash. Not normal that the montelukast and/ or Nasacort would cause the rash

## 2021-11-28 NOTE — Telephone Encounter (Signed)
    Chief Complaint: Possible reaction to new medications - Singulair and Nasacort. More itchy at night after taking Singulair. Symptoms: Itching to neck and face.Hives on buttocks. Frequency: Last week Pertinent Negatives: Patient denies any SOB, chest pain Disposition: '[]'$ ED /'[]'$ Urgent Care (no appt availability in office) / '[]'$ Appointment(In office/virtual)/ '[]'$  [facility]/ '[]'$ Home Care/ '[]'$ Refused Recommended Disposition /'[]'$ [facility]/ '[x]'$  Follow-up with PCP Additional Notes: Also received a text about a referral, but when he answers it, it says invalid. Asking for assistance with the referral.  Answer Assessment - Initial Assessment Questions 1. APPEARANCE of RASH: "Describe the rash."      Hives 2. LOCATION: "Where is the rash located?"      Neck, buttocks 3. NUMBER: "How many spots are there?"      Many 4. SIZE: "How big are the spots?" (Inches, centimeters or compare to size of a coin)      Unsure 5. ONSET: "When did the rash start?"      Last week 6. ITCHING: "Does the rash itch?" If Yes, ask: "How bad is the itch?"  (Scale 0-10; or none, mild, moderate, severe)     Severe 7. PAIN: "Does the rash hurt?" If Yes, ask: "How bad is the pain?"  (Scale 0-10; or none, mild, moderate, severe)    - NONE (0): no pain    - MILD (1-3): doesn't interfere with normal activities     - MODERATE (4-7): interferes with normal activities or awakens from sleep     - SEVERE (8-10): excruciating pain, unable to do any normal activities     None 8. OTHER SYMPTOMS: "Do you have any other symptoms?" (e.g., fever)     Face is itchy 9. PREGNANCY: "Is there any chance you are pregnant?" "When was your last menstrual period?"     N/a  Protocols used: Rash or Redness - Localized-A-AH

## 2021-11-28 NOTE — Telephone Encounter (Signed)
Called pt.

## 2021-12-07 DIAGNOSIS — C44719 Basal cell carcinoma of skin of left lower limb, including hip: Secondary | ICD-10-CM | POA: Diagnosis not present

## 2021-12-07 DIAGNOSIS — C434 Malignant melanoma of scalp and neck: Secondary | ICD-10-CM | POA: Diagnosis not present

## 2021-12-07 DIAGNOSIS — L578 Other skin changes due to chronic exposure to nonionizing radiation: Secondary | ICD-10-CM | POA: Diagnosis not present

## 2021-12-07 DIAGNOSIS — Z859 Personal history of malignant neoplasm, unspecified: Secondary | ICD-10-CM | POA: Diagnosis not present

## 2021-12-07 DIAGNOSIS — Z872 Personal history of diseases of the skin and subcutaneous tissue: Secondary | ICD-10-CM | POA: Diagnosis not present

## 2021-12-07 DIAGNOSIS — D485 Neoplasm of uncertain behavior of skin: Secondary | ICD-10-CM | POA: Diagnosis not present

## 2021-12-07 DIAGNOSIS — Z85828 Personal history of other malignant neoplasm of skin: Secondary | ICD-10-CM | POA: Diagnosis not present

## 2021-12-07 DIAGNOSIS — L570 Actinic keratosis: Secondary | ICD-10-CM | POA: Diagnosis not present

## 2021-12-07 DIAGNOSIS — C44519 Basal cell carcinoma of skin of other part of trunk: Secondary | ICD-10-CM | POA: Diagnosis not present

## 2021-12-07 DIAGNOSIS — L508 Other urticaria: Secondary | ICD-10-CM | POA: Diagnosis not present

## 2021-12-18 DIAGNOSIS — I890 Lymphedema, not elsewhere classified: Secondary | ICD-10-CM | POA: Insufficient documentation

## 2021-12-18 DIAGNOSIS — R29898 Other symptoms and signs involving the musculoskeletal system: Secondary | ICD-10-CM | POA: Insufficient documentation

## 2021-12-18 NOTE — Progress Notes (Signed)
MRN : [id number]  Angel Cummings. is a 76 y.o. (1945-11-13) male who presents with chief complaint of legs swell.  History of Present Illness:   Patient is seen for evaluation of leg pain and leg swelling. The patient first noticed the swelling remotely. The swelling is associated with pain and discoloration. The pain and swelling worsens with prolonged dependency and improves with elevation. The pain is unrelated to activity.  The patient notes that in the morning the legs are significantly improved but they steadily worsened throughout the course of the day. The patient also notes a steady worsening of the discoloration in the ankle and shin area.   The patient denies claudication symptoms.  The patient denies symptoms consistent with rest pain.  The patient denies and extensive history of DJD and LS spine disease.  The patient has no had any past angiography, interventions or vascular surgery.  Elevation makes the leg symptoms better, dependency makes them much worse. There is no history of ulcerations. The patient denies any recent changes in medications.  The patient has not been wearing graduated compression.  The patient denies a history of DVT or PE. There is no prior history of phlebitis. There is no history of primary lymphedema.  No history of malignancies. No history of trauma or groin or pelvic surgery. There is no history of radiation treatment to the groin or pelvis  The patient denies amaurosis fugax or recent TIA symptoms. There are no recent neurological changes noted. The patient denies recent episodes of angina or shortness of breath   No outpatient medications have been marked as taking for the 12/19/21 encounter (Appointment) with Delana Meyer, Dolores Lory, MD.    Past Medical History:  Diagnosis Date   Arthritis    Carpal tunnel syndrome on both sides    GERD (gastroesophageal reflux disease)    Hyperlipidemia    Hypertension    Wears hearing aid in both ears     Has.  Does not wear    Past Surgical History:  Procedure Laterality Date   COLONOSCOPY  2005   Dr Vickki Muff in [place] N/A 12/18/2019   Procedure: COLONOSCOPY WITH PROPOFOL;  Surgeon: Lucilla Lame, MD;  Location: [facility];  Service: Endoscopy;  Laterality: N/A;   HERNIA REPAIR     KNEE SURGERY Left     Social History Social History   Tobacco Use   Smoking status: Never   Smokeless tobacco: Never  Vaping Use   Vaping Use: Never used  Substance Use Topics   Alcohol use: Yes    Alcohol/week: 2.0 standard drinks of alcohol    Types: 2 Cans of beer per week    Comment: 2-3 times per week   Drug use: No    Family History No family history on file.  Allergies  Allergen Reactions   Amoxicillin Rash   Etodolac Rash     REVIEW OF SYSTEMS (Negative unless checked)  Constitutional: '[]'$ Weight loss  '[]'$ Fever  '[]'$ Chills Cardiac: '[]'$ Chest pain   '[]'$ Chest pressure   '[]'$ Palpitations   '[]'$ Shortness of breath when laying flat   '[]'$ Shortness of breath with exertion. Vascular:  '[]'$ Pain in legs with walking   '[x]'$ Pain in legs with standing  '[]'$ History of DVT   '[]'$ Phlebitis   '[x]'$ Swelling in legs   '[]'$ Varicose veins   '[]'$ Non-healing ulcers Pulmonary:   '[]'$ Uses home oxygen   '[]'$ Productive cough   '[]'$ Hemoptysis   '[]'$ Wheeze  '[]'$ COPD   '[]'$ Asthma Neurologic:  '[]'$   Dizziness   '[]'$ Seizures   '[]'$ History of stroke   '[]'$ History of TIA  '[]'$ Aphasia   '[]'$ Vissual changes   '[]'$ Weakness or numbness in arm   '[]'$ Weakness or numbness in leg Musculoskeletal:   '[]'$ Joint swelling   '[]'$ Joint pain   '[]'$ Low back pain Hematologic:  '[]'$ Easy bruising  '[]'$ Easy bleeding   '[]'$ Hypercoagulable state   '[]'$ Anemic Gastrointestinal:  '[]'$ Diarrhea   '[]'$ Vomiting  '[]'$ Gastroesophageal reflux/heartburn   '[]'$ Difficulty swallowing. Genitourinary:  '[]'$ Chronic kidney disease   '[]'$ Difficult urination  '[]'$ Frequent urination   '[]'$ Blood in urine Skin:  '[]'$ Rashes   '[]'$ Ulcers  Psychological:  '[]'$ History of anxiety   '[]'$  History of major  depression.  Physical Examination  There were no vitals filed for this visit. There is no height or weight on file to calculate BMI. Gen: WD/WN, NAD Head: [place]/AT, No temporalis wasting.  Ear/Nose/Throat: Hearing grossly intact, nares w/o erythema or drainage, pinna without lesions Eyes: PER, EOMI, sclera nonicteric.  Neck: Supple, no gross masses.  No JVD.  Pulmonary:  Good air movement, no audible wheezing, no use of accessory muscles.  Cardiac: RRR, precordium not hyperdynamic. Vascular:  scattered varicosities present bilaterally.  Mild venous stasis changes to the legs bilaterally.  3-4+ soft pitting edema  Vessel Right Left  Radial Palpable Palpable  DP Palpable  Palpable   PT Palpable  Palpable   Gastrointestinal: soft, non-distended. No guarding/no peritoneal signs.  Musculoskeletal: M/S 5/5 throughout.  No deformity.  Neurologic: CN 2-12 intact. Pain and light touch intact in extremities.  Symmetrical.  Speech is fluent. Motor exam as listed above. Psychiatric: Judgment intact, Mood & affect appropriate for pt's clinical situation. Dermatologic: Venous rashes no ulcers noted.  No changes consistent with cellulitis. Lymph : No lichenification or skin changes of chronic lymphedema.  CBC Lab Results  Component Value Date   WBC 4.7 11/15/2021   HGB 14.0 11/15/2021   HCT 41.0 11/15/2021   MCV 90 11/15/2021   PLT 208 11/15/2021    BMET    Component Value Date/Time   NA 139 11/15/2021 1412   K 4.0 11/15/2021 1412   CL 104 11/15/2021 1412   CO2 22 11/15/2021 1412   GLUCOSE 88 11/15/2021 1412   GLUCOSE 104 (H) 11/27/2019 0937   BUN 13 11/15/2021 1412   CREATININE 1.01 11/15/2021 1412   CALCIUM 9.3 11/15/2021 1412   GFRNONAA 72 04/19/2020 0858   GFRAA 84 04/19/2020 0858   CrCl cannot be calculated (Patient's most recent lab result is older than the maximum 21 days allowed.).  COAG No results found for: "INR", "PROTIME"  Radiology No results  found.   Assessment/Plan 1. Lymphedema Recommend:  No surgery or intervention at this point in time.  I have reviewed my discussion with the patient regarding venous insufficiency and why it causes symptoms. I have discussed with the patient the chronic skin changes that accompany venous insufficiency and the long term sequela such as ulceration. Patient will contnue wearing graduated compression stockings on a daily basis, as this has provided excellent control of his edema. The patient will put the stockings on first thing in the morning and removing them in the evening. The patient is reminded not to sleep in the stockings.  In addition, behavioral modification including elevation during the day will be initiated. Exercise is strongly encouraged.  Previous duplex ultrasound of the lower extremities shows normal deep system, no significant superficial reflux was identified.  Given the patient's good control and lack of any problems regarding the venous insufficiency and lymphedema  a lymph pump in not need at this time.    The patient will follow up with me PRN should anything change.  The patient voices agreement with this plan.   2. Weakness of both lower extremities This is likely the etiology of his leg symptoms.  Continue NSAID medications as already ordered, these medications have been reviewed and there are no changes at this time.  Continued activity and therapy was stressed.   3. Essential hypertension Continue antihypertensive medications as already ordered, these medications have been reviewed and there are no changes at this time.   4. Mixed hyperlipidemia Continue statin as ordered and reviewed, no changes at this time     Hortencia Pilar, MD  12/18/2021 10:50 AM

## 2021-12-19 ENCOUNTER — Ambulatory Visit (INDEPENDENT_AMBULATORY_CARE_PROVIDER_SITE_OTHER): Payer: Medicare HMO | Admitting: Vascular Surgery

## 2021-12-19 ENCOUNTER — Encounter (INDEPENDENT_AMBULATORY_CARE_PROVIDER_SITE_OTHER): Payer: Self-pay | Admitting: Vascular Surgery

## 2021-12-19 VITALS — BP 160/74 | HR 70 | Resp 16 | Wt 215.0 lb

## 2021-12-19 DIAGNOSIS — E782 Mixed hyperlipidemia: Secondary | ICD-10-CM | POA: Diagnosis not present

## 2021-12-19 DIAGNOSIS — I890 Lymphedema, not elsewhere classified: Secondary | ICD-10-CM | POA: Diagnosis not present

## 2021-12-19 DIAGNOSIS — I10 Essential (primary) hypertension: Secondary | ICD-10-CM | POA: Diagnosis not present

## 2021-12-19 DIAGNOSIS — R29898 Other symptoms and signs involving the musculoskeletal system: Secondary | ICD-10-CM

## 2021-12-25 ENCOUNTER — Encounter (INDEPENDENT_AMBULATORY_CARE_PROVIDER_SITE_OTHER): Payer: Self-pay | Admitting: Vascular Surgery

## 2021-12-27 ENCOUNTER — Ambulatory Visit
Admission: RE | Admit: 2021-12-27 | Discharge: 2021-12-27 | Disposition: A | Discharge status: Home / Self Care | Payer: Medicare HMO | Attending: Family Medicine | Admitting: Family Medicine

## 2021-12-27 ENCOUNTER — Ambulatory Visit
Admission: RE | Admit: 2021-12-27 | Discharge: 2021-12-27 | Disposition: A | Discharge status: Home / Self Care | Payer: Medicare HMO | Source: Ambulatory Visit | Attending: Family Medicine | Admitting: Family Medicine

## 2021-12-27 ENCOUNTER — Encounter: Payer: Self-pay | Admitting: Family Medicine

## 2021-12-27 ENCOUNTER — Ambulatory Visit (INDEPENDENT_AMBULATORY_CARE_PROVIDER_SITE_OTHER): Payer: Medicare HMO | Admitting: Family Medicine

## 2021-12-27 VITALS — BP 132/80 | HR 72 | Ht 72.0 in | Wt 216.0 lb

## 2021-12-27 DIAGNOSIS — R5383 Other fatigue: Secondary | ICD-10-CM

## 2021-12-27 DIAGNOSIS — R059 Cough, unspecified: Secondary | ICD-10-CM | POA: Diagnosis not present

## 2021-12-27 DIAGNOSIS — R29898 Other symptoms and signs involving the musculoskeletal system: Secondary | ICD-10-CM

## 2021-12-27 DIAGNOSIS — R0601 Orthopnea: Secondary | ICD-10-CM | POA: Insufficient documentation

## 2021-12-27 NOTE — Progress Notes (Signed)
Date:  12/27/2021   Name:  Angel Cummings.   DOB:  [birth year]   MRN:  [id number]   Chief Complaint: Fatigue (Lab work came back good. He saw V and V/ discuss whether or not to go to cardio)  Thyroid Problem Presents for follow-up visit. Symptoms include fatigue. Patient reports no anxiety, cold intolerance, constipation, depressed mood, diaphoresis, diarrhea, dry skin, hair loss, heat intolerance, hoarse voice, nail problem, palpitations, visual change, weight gain or weight loss. (Followup for fatigue) The symptoms have been stable.    Lab Results  Component Value Date   NA 139 11/15/2021   K 4.0 11/15/2021   CO2 22 11/15/2021   GLUCOSE 88 11/15/2021   BUN 13 11/15/2021   CREATININE 1.01 11/15/2021   CALCIUM 9.3 11/15/2021   EGFR 79 11/15/2021   GFRNONAA 72 04/19/2020   Lab Results  Component Value Date   CHOL 211 (H) 11/15/2021   HDL 38 (L) 11/15/2021   LDLCALC 128 (H) 11/15/2021   TRIG 254 (H) 11/15/2021   CHOLHDL 5.0 06/25/2017   Lab Results  Component Value Date   TSH 1.360 11/15/2021   No results found for: "HGBA1C" Lab Results  Component Value Date   WBC 4.7 11/15/2021   HGB 14.0 11/15/2021   HCT 41.0 11/15/2021   MCV 90 11/15/2021   PLT 208 11/15/2021   Lab Results  Component Value Date   ALT 16 11/15/2021   AST 14 11/15/2021   ALKPHOS 72 11/15/2021   BILITOT 0.4 11/15/2021   No results found for: "25OHVITD2", "25OHVITD3", "VD25OH"   Review of Systems  Constitutional:  Positive for fatigue. Negative for diaphoresis, weight gain and weight loss.  HENT:  Negative for hoarse voice.   Respiratory:  Negative for chest tightness, shortness of breath and wheezing.   Cardiovascular:  Positive for leg swelling. Negative for chest pain and palpitations.  Gastrointestinal:  Negative for constipation and diarrhea.  Endocrine: Negative for cold intolerance and heat intolerance.  Musculoskeletal:  Negative for back pain.  Psychiatric/Behavioral:  The patient  is not nervous/anxious.     Patient Active Problem List   Diagnosis Date Noted   Lymphedema 12/18/2021   Leg weakness 12/18/2021   Screen for colon cancer    Cubital tunnel syndrome on right 07/18/2017   Radiculopathy of cervical region 07/18/2017   Acute postoperative pain 12/21/2016   Open fracture of shaft of right ulna 12/20/2016   Arthritis of both hands 04/17/2016   Arthritis of hand 04/17/2016   Essential hypertension 06/01/2015   Hyperlipidemia 06/01/2015    Allergies  Allergen Reactions   Amoxicillin Rash   Etodolac Rash and Hives    Past Surgical History:  Procedure Laterality Date   COLONOSCOPY  2005   Dr Vickki Muff in [place] N/A 12/18/2019   Procedure: COLONOSCOPY WITH PROPOFOL;  Surgeon: Lucilla Lame, MD;  Location: [facility];  Service: Endoscopy;  Laterality: N/A;   HERNIA REPAIR     KNEE SURGERY Left     Social History   Tobacco Use   Smoking status: Never   Smokeless tobacco: Never  Vaping Use   Vaping Use: Never used  Substance Use Topics   Alcohol use: Yes    Alcohol/week: 2.0 standard drinks of alcohol    Types: 2 Cans of beer per week    Comment: 2-3 times per week   Drug use: No     Medication list has been reviewed and updated.  Current Meds  Medication Sig   acetaminophen (TYLENOL) 650 MG CR tablet Take 1,300 mg by mouth every 8 (eight) hours as needed for pain.   amLODipine (NORVASC) 10 MG tablet Take 1 tablet (10 mg total) by mouth daily.   aspirin 81 MG tablet Take 1 tablet (81 mg total) by mouth daily.   Ca Carbonate-Mag Hydroxide (ROLAIDS PO) Take by mouth as needed.   cetirizine (ZYRTEC) 10 MG tablet Take 10 mg by mouth at bedtime.   Cyanocobalamin (VITAMIN B-12 PO) Take by mouth daily.   diphenhydrAMINE (BENADRYL) 25 MG tablet Take 25 mg by mouth every 6 (six) hours as needed.   loratadine (CLARITIN) 10 MG tablet TAKE 1 TABLET BY MOUTH DAILY   losartan (COZAAR) 50 MG tablet Take 1 tablet (50  mg total) by mouth daily.   montelukast (SINGULAIR) 10 MG tablet Take 1 tablet (10 mg total) by mouth at bedtime.   rosuvastatin (CRESTOR) 10 MG tablet Take 1 tablet (10 mg total) by mouth once a week.   triamcinolone (NASACORT) 55 MCG/ACT AERO nasal inhaler Place 2 sprays into the nose daily.       12/27/2021    1:24 PM 11/15/2021    1:27 PM 05/10/2021    8:27 AM 10/06/2020    8:12 AM  GAD 7 : Generalized Anxiety Score  Nervous, Anxious, on Edge 0 0 0 0  Control/stop worrying 0 0 0 0  Worry too much - different things 0 0 0 0  Trouble relaxing 0 0 0 0  Restless 0 0 0 0  Easily annoyed or irritable 0 0 3 0  Afraid - awful might happen 0 0 0 0  Total GAD 7 Score 0 0 3 0  Anxiety Difficulty Not difficult at all Not difficult at all Not difficult at all        12/27/2021    1:23 PM 11/15/2021    1:27 PM 05/10/2021    8:26 AM  Depression screen PHQ 2/9  Decreased Interest  0 0  Down, Depressed, Hopeless 0 0 0  PHQ - 2 Score 0 0 0  Altered sleeping 0 0 2  Tired, decreased energy '3 3 3  ' Change in appetite 0 0 0  Feeling bad or failure about yourself  0 0 0  Trouble concentrating 0 0 0  Moving slowly or fidgety/restless 0 0 0  Suicidal thoughts 0 0 0  PHQ-9 Score '3 3 5  ' Difficult doing work/chores Not difficult at all Somewhat difficult Not difficult at all    BP Readings from Last 3 Encounters:  12/27/21 132/80  12/19/21 (!) 160/74  11/15/21 130/84    Physical Exam Vitals and nursing note reviewed.  Constitutional:      Appearance: He is overweight.  HENT:     Head:     Jaw: There is normal jaw occlusion.     Right Ear: Tympanic membrane normal.     Left Ear: Tympanic membrane normal.     Mouth/Throat:     Mouth: Mucous membranes are moist.  Neck:     Thyroid: No thyroid tenderness.     Vascular: No JVD.  Cardiovascular:     Heart sounds: S1 normal and S2 normal. No murmur heard.    No systolic murmur is present.     No diastolic murmur is present.     No gallop.  No S3 or S4 sounds.  Abdominal:     Palpations: There is no hepatomegaly or splenomegaly.  Musculoskeletal:  Right lower leg: No edema.     Left lower leg: No edema.     Wt Readings from Last 3 Encounters:  12/27/21 216 lb (98 kg)  12/19/21 215 lb (97.5 kg)  11/15/21 219 lb (99.3 kg)    BP 132/80   Pulse 72   Ht 6' (1.829 m)   Wt 216 lb (98 kg)   BMI 29.29 kg/m   Assessment and Plan:  1. Fatigue, unspecified type Overall fatigue but this may be daytime somnolence Plan: History by the spouse patient does snore and is unable to lie supine and having to be recumbent in order not to get short of breath and not to cough.  Otherwise patient has witnessed orthopnea but no PND.  EKG was done with the following results: Rate 91 sinus rhythm intervals normal no voltage criteria to suggest hypertrophy no ischemic changes such as Q waves, ST-T wave changes, or delay in R wave progression.  Otherwise within normal limits EKG.  Given the overall weakness in the legs with activity which may be a form of claudication or congestive heart failure given that this is the largest muscle groups and activity and causes weakness in the legs with the activity.  We will refer to cardiology for evaluation of possible cardiomyopathy/congestive heart failure.  In the meantime we will obtain chest x-ray to evaluate pulmonary status. - EKG 12-Lead - DG Chest 2 View; Future - Ambulatory referral to Cardiology  2. Weakness of both lower extremities Patient has been seen vein and vascular and thought to have venous insufficiency but is unable to wear today lower stockings because of the heat. - DG Chest 2 View; Future - Ambulatory referral to Cardiology  3. Orthopnea Upon speaking with his spouse patient is having 2 levels 3 pillows and get her set up Otherwise he has repetitive coughing which may be a form of orthopnea even though she denies that he has been short of breath.  I am not convinced that this may  not be cardiac in nature and may still need to be evaluated by cardiology and referral to cardiology has been placed for evaluation possible cardiac echo - DG Chest 2 View; Future - Ambulatory referral to Cardiology    Otilio Miu, MD

## 2022-01-24 DIAGNOSIS — C4362 Malignant melanoma of left upper limb, including shoulder: Secondary | ICD-10-CM | POA: Diagnosis not present

## 2022-02-10 ENCOUNTER — Telehealth: Payer: Self-pay | Admitting: Family Medicine

## 2022-02-10 NOTE — Telephone Encounter (Signed)
Copied from [place] [id number]. Topic: Appointment Scheduling - Scheduling Inquiry for Clinic >> Feb 10, 2022 11:10 AM Ludger Nutting wrote: Patient called to reschedule his AWV that is on 10/11. Please follow up with patient.

## 2022-02-27 ENCOUNTER — Telehealth: Payer: Self-pay | Admitting: Family Medicine

## 2022-02-27 NOTE — Telephone Encounter (Signed)
Copied from [place] [phone]. Topic: Medicare AWV >> Feb 27, 2022  2:45 PM Jae Dire wrote: Reason for CRM:  Left message for patient to call back and schedule Medicare Annual Wellness Visit (AWV) in office.   If unable to come into the office for AWV,  please offer to do virtually or by telephone.  Last AWV: 02/14/2021  Please schedule at [facility] time with [facility].      30 minute appointment for Virtual or phone 45 minute appointment for in office or Initial virtual/phone  Any questions, please call me at [phone]

## 2022-02-28 ENCOUNTER — Telehealth: Payer: Self-pay | Admitting: Family Medicine

## 2022-02-28 ENCOUNTER — Ambulatory Visit (INDEPENDENT_AMBULATORY_CARE_PROVIDER_SITE_OTHER): Payer: Medicare HMO | Admitting: Family Medicine

## 2022-02-28 ENCOUNTER — Encounter: Payer: Self-pay | Admitting: Family Medicine

## 2022-02-28 VITALS — BP 150/74 | HR 71 | Ht 72.0 in | Wt 217.0 lb

## 2022-02-28 DIAGNOSIS — I10 Essential (primary) hypertension: Secondary | ICD-10-CM

## 2022-02-28 DIAGNOSIS — H6123 Impacted cerumen, bilateral: Secondary | ICD-10-CM | POA: Diagnosis not present

## 2022-02-28 DIAGNOSIS — H6993 Unspecified Eustachian tube disorder, bilateral: Secondary | ICD-10-CM

## 2022-02-28 DIAGNOSIS — J301 Allergic rhinitis due to pollen: Secondary | ICD-10-CM

## 2022-02-28 MED ORDER — MONTELUKAST SODIUM 10 MG PO TABS: 10.0000 mg | ORAL_TABLET | Freq: Every day | ORAL | 3 refills | Status: DC

## 2022-02-28 NOTE — Telephone Encounter (Signed)
Copied from [phone] [phone]. Topic: General - Other >> Feb 28, 2022 10:21 AM Chapman Fitch wrote: Reason for CRM: pt has a clogged up ear  And asked to speak Baxter Flattery to see if he needs a referral / please advise

## 2022-02-28 NOTE — Telephone Encounter (Signed)
Copied from [place] [id number]. Topic: General - Other >> Feb 28, 2022 10:21 AM Chapman Fitch wrote: Reason for CRM: pt has a clogged up ear  And asked to speak Baxter Flattery to see if he needs a referral / please advise

## 2022-02-28 NOTE — Progress Notes (Signed)
Date:  02/28/2022   Name:  Angel Cummings.   DOB:  03/02/1946   MRN:  [id number]   Chief Complaint: Ear Fullness ("Feels like I got a bucket on my head" Using ear drops otc- not helping much)  Ear Fullness  Pertinent negatives include no abdominal pain, coughing, diarrhea, ear discharge, headaches, neck pain, rash or sore throat.  Hypertension This is a chronic problem. The current episode started more than 1 year ago. The problem has been waxing and waning since onset. The problem is uncontrolled. Pertinent negatives include no blurred vision, chest pain, headaches, neck pain, orthopnea, palpitations, PND or shortness of breath. There are no associated agents to hypertension. Past treatments include angiotensin blockers and calcium channel blockers. There are no compliance problems.     Lab Results  Component Value Date   NA 139 11/15/2021   K 4.0 11/15/2021   CO2 22 11/15/2021   GLUCOSE 88 11/15/2021   BUN 13 11/15/2021   CREATININE 1.01 11/15/2021   CALCIUM 9.3 11/15/2021   EGFR 79 11/15/2021   GFRNONAA 72 04/19/2020   Lab Results  Component Value Date   CHOL 211 (H) 11/15/2021   HDL 38 (L) 11/15/2021   LDLCALC 128 (H) 11/15/2021   TRIG 254 (H) 11/15/2021   CHOLHDL 5.0 06/25/2017   Lab Results  Component Value Date   TSH 1.360 11/15/2021   No results found for: "HGBA1C" Lab Results  Component Value Date   WBC 4.7 11/15/2021   HGB 14.0 11/15/2021   HCT 41.0 11/15/2021   MCV 90 11/15/2021   PLT 208 11/15/2021   Lab Results  Component Value Date   ALT 16 11/15/2021   AST 14 11/15/2021   ALKPHOS 72 11/15/2021   BILITOT 0.4 11/15/2021   No results found for: "25OHVITD2", "25OHVITD3", "VD25OH"   Review of Systems  Constitutional:  Negative for chills and fever.  HENT:  Negative for drooling, ear discharge, ear pain and sore throat.   Eyes:  Negative for blurred vision.  Respiratory:  Negative for cough, shortness of breath and wheezing.   Cardiovascular:   Negative for chest pain, palpitations, orthopnea, leg swelling and PND.  Gastrointestinal:  Negative for abdominal pain, blood in stool, constipation, diarrhea and nausea.  Endocrine: Negative for polydipsia.  Genitourinary:  Negative for dysuria, frequency, hematuria and urgency.  Musculoskeletal:  Negative for back pain, myalgias and neck pain.  Skin:  Negative for rash.  Allergic/Immunologic: Negative for environmental allergies.  Neurological:  Negative for dizziness and headaches.  Hematological:  Does not bruise/bleed easily.  Psychiatric/Behavioral:  Negative for suicidal ideas. The patient is not nervous/anxious.     Patient Active Problem List   Diagnosis Date Noted   Lymphedema 12/18/2021   Leg weakness 12/18/2021   Screen for colon cancer    Cubital tunnel syndrome on right 07/18/2017   Radiculopathy of cervical region 07/18/2017   Acute postoperative pain 12/21/2016   Open fracture of shaft of right ulna 12/20/2016   Arthritis of both hands 04/17/2016   Arthritis of hand 04/17/2016   Essential hypertension 06/01/2015   Hyperlipidemia 06/01/2015    Allergies  Allergen Reactions   Amoxicillin Rash   Etodolac Rash and Hives    Past Surgical History:  Procedure Laterality Date   COLONOSCOPY  2005   Dr Vickki Muff in [place] N/A 12/18/2019   Procedure: COLONOSCOPY WITH PROPOFOL;  Surgeon: Lucilla Lame, MD;  Location: [address];  Service: Endoscopy;  Laterality: N/A;   HERNIA REPAIR     KNEE SURGERY Left     Social History   Tobacco Use   Smoking status: Never   Smokeless tobacco: Never  Vaping Use   Vaping Use: Never used  Substance Use Topics   Alcohol use: Yes    Alcohol/week: 2.0 standard drinks of alcohol    Types: 2 Cans of beer per week    Comment: 2-3 times per week   Drug use: No     Medication list has been reviewed and updated.  Current Meds  Medication Sig   acetaminophen (TYLENOL) 650 MG CR tablet Take  1,300 mg by mouth every 8 (eight) hours as needed for pain.   amLODipine (NORVASC) 10 MG tablet Take 1 tablet (10 mg total) by mouth daily.   aspirin 81 MG tablet Take 1 tablet (81 mg total) by mouth daily.   Ca Carbonate-Mag Hydroxide (ROLAIDS PO) Take by mouth as needed.   cetirizine (ZYRTEC) 10 MG tablet Take 10 mg by mouth at bedtime.   Cyanocobalamin (VITAMIN B-12 PO) Take by mouth daily.   diphenhydrAMINE (BENADRYL) 25 MG tablet Take 25 mg by mouth every 6 (six) hours as needed.   loratadine (CLARITIN) 10 MG tablet TAKE 1 TABLET BY MOUTH DAILY   losartan (COZAAR) 50 MG tablet Take 1 tablet (50 mg total) by mouth daily. (Patient taking differently: Take 50 mg by mouth at bedtime.)   montelukast (SINGULAIR) 10 MG tablet Take 1 tablet (10 mg total) by mouth at bedtime.   rosuvastatin (CRESTOR) 10 MG tablet Take 1 tablet (10 mg total) by mouth once a week.   triamcinolone (NASACORT) 55 MCG/ACT AERO nasal inhaler Place 2 sprays into the nose daily.       02/28/2022    3:57 PM 12/27/2021    1:24 PM 11/15/2021    1:27 PM 05/10/2021    8:27 AM  GAD 7 : Generalized Anxiety Score  Nervous, Anxious, on Edge 0 0 0 0  Control/stop worrying 0 0 0 0  Worry too much - different things 0 0 0 0  Trouble relaxing 0 0 0 0  Restless 0 0 0 0  Easily annoyed or irritable 0 0 0 3  Afraid - awful might happen 0 0 0 0  Total GAD 7 Score 0 0 0 3  Anxiety Difficulty Not difficult at all Not difficult at all Not difficult at all Not difficult at all       02/28/2022    3:56 PM 12/27/2021    1:23 PM 11/15/2021    1:27 PM  Depression screen PHQ 2/9  Decreased Interest 0  0  Down, Depressed, Hopeless 0 0 0  PHQ - 2 Score 0 0 0  Altered sleeping 0 0 0  Tired, decreased energy 0 3 3  Change in appetite 0 0 0  Feeling bad or failure about yourself  0 0 0  Trouble concentrating 0 0 0  Moving slowly or fidgety/restless 0 0 0  Suicidal thoughts 0 0 0  PHQ-9 Score 0 3 3  Difficult doing work/chores Not  difficult at all Not difficult at all Somewhat difficult    BP Readings from Last 3 Encounters:  02/28/22 (!) 150/74  12/27/21 132/80  12/19/21 (!) 160/74    Physical Exam Vitals and nursing note reviewed.  HENT:     Head: Normocephalic.     Right Ear: External ear normal. Tympanic membrane is retracted.     Left Ear:  External ear normal. Tympanic membrane is retracted.     Nose: Nose normal.     Mouth/Throat:     Mouth: Mucous membranes are moist.  Eyes:     General: No scleral icterus.       Right eye: No discharge.        Left eye: No discharge.     Conjunctiva/sclera: Conjunctivae normal.     Pupils: Pupils are equal, round, and reactive to light.  Neck:     Thyroid: No thyromegaly.     Vascular: No JVD.     Trachea: No tracheal deviation.  Cardiovascular:     Rate and Rhythm: Normal rate and regular rhythm.     Heart sounds: Normal heart sounds, S1 normal and S2 normal. No murmur heard.    No systolic murmur is present.     No diastolic murmur is present.     No friction rub. No gallop. No S3 or S4 sounds.  Pulmonary:     Effort: No respiratory distress.     Breath sounds: Normal breath sounds. No decreased breath sounds, wheezing, rhonchi or rales.  Abdominal:     General: Bowel sounds are normal.     Palpations: Abdomen is soft. There is no mass.     Tenderness: There is no abdominal tenderness. There is no guarding or rebound.  Musculoskeletal:        General: No tenderness. Normal range of motion.     Cervical back: Normal range of motion and neck supple.  Lymphadenopathy:     Cervical: No cervical adenopathy.  Skin:    General: Skin is warm.     Findings: No rash.  Neurological:     Mental Status: He is alert.     Cranial Nerves: No cranial nerve deficit.     Wt Readings from Last 3 Encounters:  02/28/22 217 lb (98.4 kg)  12/27/21 216 lb (98 kg)  12/19/21 215 lb (97.5 kg)    BP (!) 150/74 (BP Location: Left Arm, Cuff Size: Large)   Pulse 71    Ht 6' (1.829 m)   Wt 217 lb (98.4 kg)   SpO2 97%   BMI 29.43 kg/m   Assessment and Plan:  1. Essential hypertension .  Uncontrolled.  Stable.  Blood pressure 150/74 and it was similar when he had previous surgery.  We will increase the losartan to 100 mg once a day and continue amlodipine at current dosing is once a day.  I have suggested that they take this together in the morning since blood pressure was physiologically decreased when lying supine when asleep.  Patient will return in 4 weeks at which time we will recheck blood pressure.  2. Bilateral impacted cerumen Bilateral cerumen impaction that was removed with gentle irrigation.  3. Eustachian tube dysfunction, bilateral Noted to have retracted tympanic membranes bilateral.  Patient will continue nasal steroid spray as well as continued Singulair 10 mg 1 nightly. - montelukast (SINGULAIR) 10 MG tablet; Take 1 tablet (10 mg total) by mouth at bedtime.  Dispense: 30 tablet; Refill: 3  4. Non-seasonal allergic rhinitis due to pollen As noted above on Singulair for pollen rhinitis as well. - montelukast (SINGULAIR) 10 MG tablet; Take 1 tablet (10 mg total) by mouth at bedtime.  Dispense: 30 tablet; Refill: 3    Otilio Miu, MD

## 2022-02-28 NOTE — Telephone Encounter (Signed)
Patient returned call back about problem with his ear, sopped up hears swishing sounds, wants to knokw if he needs referral or appt

## 2022-02-28 NOTE — Telephone Encounter (Signed)
Pt has an appt today 02/28/22.  KP

## 2022-03-06 ENCOUNTER — Encounter (INDEPENDENT_AMBULATORY_CARE_PROVIDER_SITE_OTHER): Payer: Self-pay

## 2022-03-21 ENCOUNTER — Other Ambulatory Visit: Payer: Self-pay | Admitting: Family Medicine

## 2022-03-21 DIAGNOSIS — H6993 Unspecified Eustachian tube disorder, bilateral: Secondary | ICD-10-CM

## 2022-03-21 DIAGNOSIS — I10 Essential (primary) hypertension: Secondary | ICD-10-CM

## 2022-03-21 DIAGNOSIS — J301 Allergic rhinitis due to pollen: Secondary | ICD-10-CM

## 2022-03-21 NOTE — Telephone Encounter (Signed)
Unable to refill per protocol, Rx expired.  Losartan was discontinued 02/28/22, dose change. Singulair is too soon to refill. Last refill 02/28/22 for 30 and 3. Will refuse requests.  Requested Prescriptions  Pending Prescriptions Disp Refills   montelukast (SINGULAIR) 10 MG tablet 30 tablet 3    Sig: Take 1 tablet (10 mg total) by mouth at bedtime.     Pulmonology:  Leukotriene Inhibitors Passed - 03/21/2022  4:20 PM      Passed - Valid encounter within last 12 months    Recent Outpatient Visits           3 weeks ago Essential hypertension   [facility] [place], [place], MD   2 months ago Fatigue, unspecified type   [facility] at [place], Deanna C, MD   4 months ago Essential hypertension   [facility] [place], Deanna C, MD   10 months ago Essential hypertension   [facility] [place], Deanna C, MD   1 year ago Essential hypertension   [facility] [place], [place], MD       Future Appointments             In 1 week Juline Patch, MD [facility] at [facility], [place]   In 1 month Juline Patch, MD [facility] at [place], [facility]             losartan (COZAAR) 50 MG tablet 90 tablet 1    Sig: Take 1 tablet (50 mg total) by mouth daily.     Cardiovascular:  Angiotensin Receptor Blockers Failed - 03/21/2022  4:20 PM      Failed - Last BP in normal range    BP Readings from Last 1 Encounters:  02/28/22 (!) 150/74         Passed - Cr in normal range and within 180 days    Creatinine, Ser  Date Value Ref Range Status  11/15/2021 1.01 0.76 - 1.27 mg/dL Final         Passed - K in normal range and within 180 days    Potassium  Date Value Ref  Range Status  11/15/2021 4.0 3.5 - 5.2 mmol/L Final         Passed - Patient is not pregnant      Passed - Valid encounter within last 6 months    Recent Outpatient Visits           3 weeks ago Essential hypertension   [facility] [place], Deanna C, MD   2 months ago Fatigue, unspecified type   [facility] [place], Deanna C, MD   4 months ago Essential hypertension   [facility] [place], Deanna C, MD   10 months ago Essential hypertension   [place] [facility] at [place], Deanna C, MD   1 year ago Essential hypertension   [facility] [place], Deanna C, MD       Future Appointments  In 1 week Juline Patch, MD [facility] at [facility], [place]   In 1 month Juline Patch, MD [place] [facility] at [facility], [facility]

## 2022-03-21 NOTE — Telephone Encounter (Signed)
Medication Refill - Medication: montelukast (SINGULAIR) 10 MG table  losartan (COZAAR) 50 MG tablet   Has the patient contacted their pharmacy? Yes.   (Agent: If no, request that the patient contact the pharmacy for the refill. If patient does not wish to contact the pharmacy document the reason why and proceed with request.) (Agent: If yes, when and what did the pharmacy advise?)  Preferred Pharmacy (with phone number or street name):   [place], [place]  [address] [place] [address]  Phone: [phone] Fax: [phone]   Has the patient been seen for an appointment in the last year OR does the patient have an upcoming appointment? Yes.    Agent: Please be advised that RX refills may take up to 3 business days. We ask that you follow-up with your pharmacy.

## 2022-03-22 ENCOUNTER — Telehealth: Payer: Self-pay | Admitting: Family Medicine

## 2022-03-22 ENCOUNTER — Other Ambulatory Visit: Payer: Self-pay

## 2022-03-22 MED ORDER — LOSARTAN POTASSIUM 100 MG PO TABS: 100.0000 mg | ORAL_TABLET | Freq: Every day | ORAL | 0 refills | Status: DC

## 2022-03-22 NOTE — Telephone Encounter (Signed)
losartan (COZAAR) 50 MG tablet [[id number]]  DISCONTINUED   Pt has called back in re to this and states he needs a new script or someone to call him to explain what is going on, pt going out of town and needs med. FU at [phone]

## 2022-03-22 NOTE — Telephone Encounter (Signed)
Copied from [place] [phone]. Topic: General - Other >> Mar 22, 2022  1:06 PM Leitha Schuller wrote: Pt inquiring why losartan (COZAAR) 50 MG  has not been filled  Please fu w/ pt

## 2022-03-22 NOTE — Telephone Encounter (Signed)
Called pt let him know that I sent in a refill for 100 MG. Pt verbalized understanding.  KP

## 2022-03-23 ENCOUNTER — Other Ambulatory Visit: Payer: Self-pay

## 2022-03-23 DIAGNOSIS — I10 Essential (primary) hypertension: Secondary | ICD-10-CM

## 2022-03-23 MED ORDER — LOSARTAN POTASSIUM 100 MG PO TABS: 100.0000 mg | ORAL_TABLET | Freq: Every day | ORAL | 0 refills | Status: DC

## 2022-03-24 ENCOUNTER — Telehealth: Payer: Self-pay | Admitting: Family Medicine

## 2022-03-24 NOTE — Telephone Encounter (Signed)
Spoke with patient he req CB 04/2022 khc

## 2022-03-28 ENCOUNTER — Encounter: Payer: Self-pay | Admitting: Family Medicine

## 2022-03-28 ENCOUNTER — Ambulatory Visit (INDEPENDENT_AMBULATORY_CARE_PROVIDER_SITE_OTHER): Payer: Medicare HMO | Admitting: Family Medicine

## 2022-03-28 VITALS — BP 130/60 | HR 65 | Ht 72.0 in | Wt 217.0 lb

## 2022-03-28 DIAGNOSIS — J301 Allergic rhinitis due to pollen: Secondary | ICD-10-CM

## 2022-03-28 DIAGNOSIS — I10 Essential (primary) hypertension: Secondary | ICD-10-CM | POA: Diagnosis not present

## 2022-03-28 MED ORDER — AMLODIPINE BESYLATE 10 MG PO TABS: 10.0000 mg | ORAL_TABLET | Freq: Every day | ORAL | 0 refills | Status: DC

## 2022-03-28 MED ORDER — LOSARTAN POTASSIUM 100 MG PO TABS: 100.0000 mg | ORAL_TABLET | Freq: Every day | ORAL | 0 refills | Status: DC

## 2022-03-28 MED ORDER — AZELASTINE HCL 0.1 % NA SOLN: 1.0000 | Freq: Two times a day (BID) | NASAL | 12 refills | Status: DC

## 2022-03-28 NOTE — Progress Notes (Signed)
Date:  03/28/2022   Name:  Angel Cummings.   DOB:  Jan 06, 1946   MRN:  [id number]   Chief Complaint: Hypertension (Increased losartan to 148m daily)  Hypertension This is a chronic problem. The current episode started more than 1 year ago. The problem has been gradually improving since onset. The problem is controlled. Pertinent negatives include no blurred vision, chest pain, headaches, neck pain, palpitations or shortness of breath.    Lab Results  Component Value Date   NA 139 11/15/2021   K 4.0 11/15/2021   CO2 22 11/15/2021   GLUCOSE 88 11/15/2021   BUN 13 11/15/2021   CREATININE 1.01 11/15/2021   CALCIUM 9.3 11/15/2021   EGFR 79 11/15/2021   GFRNONAA 72 04/19/2020   Lab Results  Component Value Date   CHOL 211 (H) 11/15/2021   HDL 38 (L) 11/15/2021   LDLCALC 128 (H) 11/15/2021   TRIG 254 (H) 11/15/2021   CHOLHDL 5.0 06/25/2017   Lab Results  Component Value Date   TSH 1.360 11/15/2021   No results found for: "HGBA1C" Lab Results  Component Value Date   WBC 4.7 11/15/2021   HGB 14.0 11/15/2021   HCT 41.0 11/15/2021   MCV 90 11/15/2021   PLT 208 11/15/2021   Lab Results  Component Value Date   ALT 16 11/15/2021   AST 14 11/15/2021   ALKPHOS 72 11/15/2021   BILITOT 0.4 11/15/2021   No results found for: "25OHVITD2", "25OHVITD3", "VD25OH"   Review of Systems  Constitutional:  Negative for chills and fever.  HENT:  Negative for drooling, ear discharge, ear pain and sore throat.   Eyes:  Negative for blurred vision.  Respiratory:  Negative for cough, shortness of breath and wheezing.   Cardiovascular:  Negative for chest pain, palpitations and leg swelling.  Gastrointestinal:  Negative for abdominal pain, blood in stool, constipation, diarrhea and nausea.  Endocrine: Negative for polydipsia.  Genitourinary:  Negative for dysuria, frequency, hematuria and urgency.  Musculoskeletal:  Negative for back pain, myalgias and neck pain.  Skin:  Negative for  rash.  Allergic/Immunologic: Negative for environmental allergies.  Neurological:  Negative for dizziness and headaches.  Hematological:  Does not bruise/bleed easily.  Psychiatric/Behavioral:  Negative for suicidal ideas. The patient is not nervous/anxious.     Patient Active Problem List   Diagnosis Date Noted   Lymphedema 12/18/2021   Leg weakness 12/18/2021   Screen for colon cancer    Cubital tunnel syndrome on right 07/18/2017   Radiculopathy of cervical region 07/18/2017   Acute postoperative pain 12/21/2016   Open fracture of shaft of right ulna 12/20/2016   Arthritis of both hands 04/17/2016   Arthritis of hand 04/17/2016   Essential hypertension 06/01/2015   Hyperlipidemia 06/01/2015    Allergies  Allergen Reactions   Amoxicillin Rash   Etodolac Rash and Hives    Past Surgical History:  Procedure Laterality Date   COLONOSCOPY  2005   Dr FVickki Muffin [place] 12/18/2019   Procedure: COLONOSCOPY WITH PROPOFOL;  Surgeon: WLucilla Lame MD;  [place]  Service: Endoscopy;  Laterality: N/A;   HERNIA REPAIR     KNEE SURGERY Left     Social History   Tobacco Use   Smoking status: Never   Smokeless tobacco: Never  Vaping Use   Vaping Use: Never used  Substance Use Topics   Alcohol use: Yes    Alcohol/week: 2.0 standard drinks of alcohol  Types: 2 Cans of beer per week    Comment: 2-3 times per week   Drug use: No     Medication list has been reviewed and updated.  Current Meds  Medication Sig   acetaminophen (TYLENOL) 650 MG CR tablet Take 1,300 mg by mouth every 8 (eight) hours as needed for pain.   amLODipine (NORVASC) 10 MG tablet Take 1 tablet (10 mg total) by mouth daily.   aspirin 81 MG tablet Take 1 tablet (81 mg total) by mouth daily.   Ca Carbonate-Mag Hydroxide (ROLAIDS PO) Take by mouth as needed.   cetirizine (ZYRTEC) 10 MG tablet Take 10 mg by mouth at bedtime.   Cyanocobalamin (VITAMIN B-12  PO) Take by mouth daily.   diphenhydrAMINE (BENADRYL) 25 MG tablet Take 25 mg by mouth every 6 (six) hours as needed.   loratadine (CLARITIN) 10 MG tablet TAKE 1 TABLET BY MOUTH DAILY   losartan (COZAAR) 100 MG tablet Take 1 tablet (100 mg total) by mouth daily.   montelukast (SINGULAIR) 10 MG tablet Take 1 tablet (10 mg total) by mouth at bedtime.   rosuvastatin (CRESTOR) 10 MG tablet Take 1 tablet (10 mg total) by mouth once a week.   triamcinolone (NASACORT) 55 MCG/ACT AERO nasal inhaler Place 2 sprays into the nose daily.       03/28/2022    1:51 PM 02/28/2022    3:57 PM 12/27/2021    1:24 PM 11/15/2021    1:27 PM  GAD 7 : Generalized Anxiety Score  Nervous, Anxious, on Edge 0 0 0 0  Control/stop worrying 0 0 0 0  Worry too much - different things 0 0 0 0  Trouble relaxing 0 0 0 0  Restless 0 0 0 0  Easily annoyed or irritable 0 0 0 0  Afraid - awful might happen 0 0 0 0  Total GAD 7 Score 0 0 0 0  Anxiety Difficulty Not difficult at all Not difficult at all Not difficult at all Not difficult at all       03/28/2022    1:50 PM 02/28/2022    3:56 PM 12/27/2021    1:23 PM  Depression screen PHQ 2/9  Decreased Interest 0 0   Down, Depressed, Hopeless 0 0 0  PHQ - 2 Score 0 0 0  Altered sleeping 0 0 0  Tired, decreased energy 0 0 3  Change in appetite 0 0 0  Feeling bad or failure about yourself  0 0 0  Trouble concentrating 0 0 0  Moving slowly or fidgety/restless 0 0 0  Suicidal thoughts 0 0 0  PHQ-9 Score 0 0 3  Difficult doing work/chores Not difficult at all Not difficult at all Not difficult at all    BP Readings from Last 3 Encounters:  03/28/22 130/60  02/28/22 (!) 150/74  12/27/21 132/80    Physical Exam Vitals and nursing note reviewed.  HENT:     Head: Normocephalic.     Right Ear: External ear normal.     Left Ear: External ear normal.     Nose: Nose normal.  Eyes:     General: No scleral icterus.       Right eye: No discharge.        Left eye:  No discharge.     Conjunctiva/sclera: Conjunctivae normal.     Pupils: Pupils are equal, round, and reactive to light.  Neck:     Thyroid: No thyromegaly.     Vascular: No  JVD.     Trachea: No tracheal deviation.  Cardiovascular:     Rate and Rhythm: Normal rate and regular rhythm.     Heart sounds: Normal heart sounds. No murmur heard.    No friction rub. No gallop.  Pulmonary:     Effort: No respiratory distress.     Breath sounds: Normal breath sounds. No wheezing or rales.  Abdominal:     General: Bowel sounds are normal.     Palpations: Abdomen is soft. There is no mass.     Tenderness: There is no abdominal tenderness. There is no guarding or rebound.  Musculoskeletal:        General: No tenderness. Normal range of motion.     Cervical back: Normal range of motion and neck supple.  Lymphadenopathy:     Cervical: No cervical adenopathy.  Skin:    General: Skin is warm.     Findings: No rash.  Neurological:     Mental Status: He is alert and oriented to person, place, and time.     Cranial Nerves: No cranial nerve deficit.     Deep Tendon Reflexes: Reflexes are normal and symmetric.     Wt Readings from Last 3 Encounters:  03/28/22 217 lb (98.4 kg)  02/28/22 217 lb (98.4 kg)  12/27/21 216 lb (98 kg)    BP 130/60 (BP Location: Right Arm, Cuff Size: Large)   Pulse 65   Ht 6' (1.829 m)   Wt 217 lb (98.4 kg)   SpO2 98%   BMI 29.43 kg/m   Assessment and Plan:  1. Essential hypertension Chronic.  Controlled.  Stable.  Blood pressure today is 130/60.  Continue amlodipine 10 mg once a day and losartan 100 mg once a day.  Will recheck patient in 10 weeks. - amLODipine (NORVASC) 10 MG tablet; Take 1 tablet (10 mg total) by mouth daily.  Dispense: 90 tablet; Refill: 0 - losartan (COZAAR) 100 MG tablet; Take 1 tablet (100 mg total) by mouth daily.  Dispense: 90 tablet; Refill: 0  2. Seasonal allergic rhinitis due to pollen Chronic.  Seasonal.  Stable.  Patient is having  more issues with particularly rhinorrhea.  This is not being helped by the Nasacort which is to be expected which is only opening sinus passages.  We will add some Astelin nasal spray 1 spray in each nostril twice a day. - azelastine (ASTELIN) 0.1 % nasal spray; Place 1 spray into both nostrils 2 (two) times daily. Use in each nostril as directed  Dispense: 30 mL; Refill: 12    Otilio Miu, MD

## 2022-04-12 ENCOUNTER — Telehealth: Payer: Self-pay | Admitting: Family Medicine

## 2022-04-12 NOTE — Telephone Encounter (Signed)
Copied from [place] [phone]. Topic: Medicare AWV >> Apr 12, 2022 11:40 AM Devoria Glassing wrote: Reason for CRM: Left message tor patient to schedule Medicare Annual Wellness Visit (AWV) with [facility] Advisor.  Appointment can be an offiice/telephone or virtual visit;  Please call [phone] ask for [facility]

## 2022-04-13 DIAGNOSIS — C4362 Malignant melanoma of left upper limb, including shoulder: Secondary | ICD-10-CM | POA: Diagnosis not present

## 2022-04-19 DIAGNOSIS — I10 Essential (primary) hypertension: Secondary | ICD-10-CM | POA: Diagnosis not present

## 2022-04-19 DIAGNOSIS — I480 Paroxysmal atrial fibrillation: Secondary | ICD-10-CM | POA: Diagnosis not present

## 2022-04-19 DIAGNOSIS — R5383 Other fatigue: Secondary | ICD-10-CM | POA: Diagnosis not present

## 2022-04-20 ENCOUNTER — Telehealth: Payer: Self-pay | Admitting: Family Medicine

## 2022-04-20 NOTE — Telephone Encounter (Signed)
Copied from [place] [id number]. Topic: General - Other >> Apr 20, 2022  8:31 AM Leitha Schuller wrote: Pt requesting a cb from [place] regarding medications

## 2022-05-18 ENCOUNTER — Ambulatory Visit: Payer: Medicare HMO | Admitting: Family Medicine

## 2022-05-29 DIAGNOSIS — I480 Paroxysmal atrial fibrillation: Secondary | ICD-10-CM | POA: Diagnosis not present

## 2022-06-05 ENCOUNTER — Telehealth: Payer: Self-pay | Admitting: Family Medicine

## 2022-06-05 NOTE — Telephone Encounter (Signed)
Left message tor patient to schedule Medicare Annual Wellness Visit (AWV) with [place], [place]  Appointment can be an offiice/telephone or virtual visit;  Please call [phone] ask for Juliann Pulse.

## 2022-06-05 NOTE — Telephone Encounter (Signed)
Spoke with patient he declined the AWV stating [place] Falling was coming to his house this week and he did not want to do two AWV.  He declined AWV after it was explained.

## 2022-06-06 ENCOUNTER — Encounter: Payer: Self-pay | Admitting: Family Medicine

## 2022-06-06 ENCOUNTER — Ambulatory Visit (INDEPENDENT_AMBULATORY_CARE_PROVIDER_SITE_OTHER): Payer: Medicare HMO | Admitting: Family Medicine

## 2022-06-06 VITALS — BP 137/76 | HR 64 | Ht 72.0 in | Wt 213.0 lb

## 2022-06-06 DIAGNOSIS — H6993 Unspecified Eustachian tube disorder, bilateral: Secondary | ICD-10-CM | POA: Diagnosis not present

## 2022-06-06 DIAGNOSIS — J301 Allergic rhinitis due to pollen: Secondary | ICD-10-CM | POA: Diagnosis not present

## 2022-06-06 DIAGNOSIS — I10 Essential (primary) hypertension: Secondary | ICD-10-CM | POA: Diagnosis not present

## 2022-06-06 DIAGNOSIS — E782 Mixed hyperlipidemia: Secondary | ICD-10-CM | POA: Diagnosis not present

## 2022-06-06 MED ORDER — ROSUVASTATIN CALCIUM 10 MG PO TABS: 10.0000 mg | ORAL_TABLET | ORAL | 1 refills | Status: DC

## 2022-06-06 MED ORDER — LOSARTAN POTASSIUM 100 MG PO TABS: 100.0000 mg | ORAL_TABLET | Freq: Every day | ORAL | 1 refills | Status: DC

## 2022-06-06 MED ORDER — AMLODIPINE BESYLATE 10 MG PO TABS: 10.0000 mg | ORAL_TABLET | Freq: Every day | ORAL | 1 refills | Status: DC

## 2022-06-06 MED ORDER — MONTELUKAST SODIUM 10 MG PO TABS: 10.0000 mg | ORAL_TABLET | Freq: Every day | ORAL | 3 refills | Status: DC

## 2022-06-06 MED ORDER — AZELASTINE HCL 0.1 % NA SOLN: 1.0000 | Freq: Two times a day (BID) | NASAL | 12 refills | Status: AC

## 2022-06-06 NOTE — Progress Notes (Signed)
Date:  06/06/2022   Name:  Angel Cummings.   DOB:  10-15-1945   MRN:  [id number]   Chief Complaint: Hypertension (Increased losartan at last visit)  Hypertension This is a chronic problem. The current episode started more than 1 year ago. The problem has been gradually improving since onset. The problem is controlled. Pertinent negatives include no anxiety, blurred vision, chest pain, headaches, malaise/fatigue, neck pain, orthopnea, palpitations, peripheral edema, PND, shortness of breath or sweats. There are no associated agents to hypertension. The current treatment provides moderate improvement. There are no compliance problems.  There is no history of angina, kidney disease, CAD/MI, CVA, heart failure, left ventricular hypertrophy, PVD or retinopathy. There is no history of chronic renal disease or a hypertension causing med.  URI  This is a chronic problem. The problem has been gradually improving. Pertinent negatives include no abdominal pain, chest pain, coughing, diarrhea, dysuria, ear pain, headaches, nausea, neck pain, rash, sore throat or wheezing.    Lab Results  Component Value Date   NA 139 11/15/2021   K 4.0 11/15/2021   CO2 22 11/15/2021   GLUCOSE 88 11/15/2021   BUN 13 11/15/2021   CREATININE 1.01 11/15/2021   CALCIUM 9.3 11/15/2021   EGFR 79 11/15/2021   GFRNONAA 72 04/19/2020   Lab Results  Component Value Date   CHOL 211 (H) 11/15/2021   HDL 38 (L) 11/15/2021   LDLCALC 128 (H) 11/15/2021   TRIG 254 (H) 11/15/2021   CHOLHDL 5.0 06/25/2017   Lab Results  Component Value Date   TSH 1.360 11/15/2021   No results found for: "HGBA1C" Lab Results  Component Value Date   WBC 4.7 11/15/2021   HGB 14.0 11/15/2021   HCT 41.0 11/15/2021   MCV 90 11/15/2021   PLT 208 11/15/2021   Lab Results  Component Value Date   ALT 16 11/15/2021   AST 14 11/15/2021   ALKPHOS 72 11/15/2021   BILITOT 0.4 11/15/2021   No results found for: "25OHVITD2", "25OHVITD3",  "VD25OH"   Review of Systems  Constitutional:  Negative for chills, fever and malaise/fatigue.  HENT:  Negative for drooling, ear discharge, ear pain and sore throat.   Eyes:  Negative for blurred vision.  Respiratory:  Negative for cough, shortness of breath and wheezing.   Cardiovascular:  Negative for chest pain, palpitations, orthopnea, leg swelling and PND.  Gastrointestinal:  Negative for abdominal pain, blood in stool, constipation, diarrhea and nausea.  Endocrine: Negative for polydipsia.  Genitourinary:  Negative for dysuria, frequency, hematuria and urgency.  Musculoskeletal:  Negative for back pain, myalgias and neck pain.  Skin:  Negative for rash.  Allergic/Immunologic: Negative for environmental allergies.  Neurological:  Negative for dizziness and headaches.  Hematological:  Does not bruise/bleed easily.  Psychiatric/Behavioral:  Negative for suicidal ideas. The patient is not nervous/anxious.     Patient Active Problem List   Diagnosis Date Noted   Lymphedema 12/18/2021   Leg weakness 12/18/2021   Screen for colon cancer    Cubital tunnel syndrome on right 07/18/2017   Radiculopathy of cervical region 07/18/2017   Acute postoperative pain 12/21/2016   Open fracture of shaft of right ulna 12/20/2016   Arthritis of both hands 04/17/2016   Arthritis of hand 04/17/2016   Essential hypertension 06/01/2015   Hyperlipidemia 06/01/2015    Allergies  Allergen Reactions   Amoxicillin Rash   Etodolac Rash and Hives    Past Surgical History:  Procedure Laterality Date  COLONOSCOPY  2005   Dr Vickki Muff in [place] N/A 12/18/2019   Procedure: COLONOSCOPY WITH PROPOFOL;  Surgeon: Lucilla Lame, MD;  Location: [facility];  Service: Endoscopy;  Laterality: N/A;   HERNIA REPAIR     KNEE SURGERY Left     Social History   Tobacco Use   Smoking status: Never   Smokeless tobacco: Never  Vaping Use   Vaping Use: Never used  Substance  Use Topics   Alcohol use: Yes    Alcohol/week: 2.0 standard drinks of alcohol    Types: 2 Cans of beer per week    Comment: 2-3 times per week   Drug use: No     Medication list has been reviewed and updated.  Current Meds  Medication Sig   acetaminophen (TYLENOL) 650 MG CR tablet Take 1,300 mg by mouth every 8 (eight) hours as needed for pain.   amLODipine (NORVASC) 10 MG tablet Take 1 tablet (10 mg total) by mouth daily.   azelastine (ASTELIN) 0.1 % nasal spray Place 1 spray into both nostrils 2 (two) times daily. Use in each nostril as directed   Ca Carbonate-Mag Hydroxide (ROLAIDS PO) Take by mouth as needed.   cetirizine (ZYRTEC) 10 MG tablet Take 10 mg by mouth at bedtime.   diphenhydrAMINE (BENADRYL) 25 MG tablet Take 25 mg by mouth every 6 (six) hours as needed.   losartan (COZAAR) 100 MG tablet Take 1 tablet (100 mg total) by mouth daily.   montelukast (SINGULAIR) 10 MG tablet Take 1 tablet (10 mg total) by mouth at bedtime.   rosuvastatin (CRESTOR) 10 MG tablet Take 1 tablet (10 mg total) by mouth once a week.   triamcinolone (NASACORT) 55 MCG/ACT AERO nasal inhaler Place 2 sprays into the nose daily.       06/06/2022    1:46 PM 03/28/2022    1:51 PM 02/28/2022    3:57 PM 12/27/2021    1:24 PM  GAD 7 : Generalized Anxiety Score  Nervous, Anxious, on Edge 0 0 0 0  Control/stop worrying 0 0 0 0  Worry too much - different things 0 0 0 0  Trouble relaxing 0 0 0 0  Restless 0 0 0 0  Easily annoyed or irritable 0 0 0 0  Afraid - awful might happen 0 0 0 0  Total GAD 7 Score 0 0 0 0  Anxiety Difficulty Not difficult at all Not difficult at all Not difficult at all Not difficult at all       06/06/2022    1:46 PM 03/28/2022    1:50 PM 02/28/2022    3:56 PM  Depression screen PHQ 2/9  Decreased Interest 0 0 0  Down, Depressed, Hopeless 0 0 0  PHQ - 2 Score 0 0 0  Altered sleeping 0 0 0  Tired, decreased energy 0 0 0  Change in appetite 0 0 0  Feeling bad or  failure about yourself  0 0 0  Trouble concentrating 0 0 0  Moving slowly or fidgety/restless 0 0 0  Suicidal thoughts 0 0 0  PHQ-9 Score 0 0 0  Difficult doing work/chores Not difficult at all Not difficult at all Not difficult at all    BP Readings from Last 3 Encounters:  06/06/22 (!) 140/80  03/28/22 130/60  02/28/22 (!) 150/74    Physical Exam Vitals reviewed.  HENT:     Head: Normocephalic.     Right Ear: Tympanic membrane  and external ear normal.     Left Ear: Tympanic membrane and external ear normal.     Nose: Nose normal. No congestion or rhinorrhea.  Eyes:     General: No scleral icterus.       Right eye: No discharge.        Left eye: No discharge.     Conjunctiva/sclera: Conjunctivae normal.     Pupils: Pupils are equal, round, and reactive to light.  Neck:     Thyroid: No thyromegaly.     Vascular: No JVD.     Trachea: No tracheal deviation.  Cardiovascular:     Rate and Rhythm: Normal rate. Rhythm irregular.     Pulses: Normal pulses.     Heart sounds: Normal heart sounds, S1 normal and S2 normal. No murmur heard.    No systolic murmur is present.     No diastolic murmur is present.     No friction rub. No gallop. No S3 or S4 sounds.  Pulmonary:     Effort: No respiratory distress.     Breath sounds: Normal breath sounds. No wheezing, rhonchi or rales.  Abdominal:     General: Bowel sounds are normal.     Palpations: Abdomen is soft. There is no mass.     Tenderness: There is no abdominal tenderness. There is no guarding or rebound.  Musculoskeletal:        General: No tenderness. Normal range of motion.     Cervical back: Normal range of motion and neck supple.  Lymphadenopathy:     Cervical: No cervical adenopathy.  Skin:    General: Skin is warm.     Findings: No bruising, erythema or rash.  Neurological:     Mental Status: He is alert and oriented to person, place, and time.     Cranial Nerves: No cranial nerve deficit.     Coordination:  Coordination normal.     Gait: Gait normal.     Deep Tendon Reflexes: Reflexes are normal and symmetric.     Wt Readings from Last 3 Encounters:  06/06/22 213 lb (96.6 kg)  03/28/22 217 lb (98.4 kg)  02/28/22 217 lb (98.4 kg)    BP (!) 140/80 (BP Location: Right Arm, Cuff Size: Large)   Pulse 64   Ht 6' (1.829 m)   Wt 213 lb (96.6 kg)   SpO2 99%   BMI 28.89 kg/m   Assessment and Plan:  1. Essential hypertension Chronic.  Controlled.  Stable.  Blood pressure 137/76.  Asymptomatic.  Tolerating medication well.  Continue amlodipine 10 mg once a day and losartan 100 mg once a day.  Review of previous labs acceptable at this time and will recheck patient in 6 months. - amLODipine (NORVASC) 10 MG tablet; Take 1 tablet (10 mg total) by mouth daily.  Dispense: 90 tablet; Refill: 1 - losartan (COZAAR) 100 MG tablet; Take 1 tablet (100 mg total) by mouth daily.  Dispense: 90 tablet; Refill: 1  2. Mixed hyperlipidemia Chronic.  Controlled.  Stable.  Patient is agreeing to take 1 rosuvastatin 10 mg weekly.  Will recheck in 6 months. - rosuvastatin (CRESTOR) 10 MG tablet; Take 1 tablet (10 mg total) by mouth once a week.  Dispense: 12 tablet; Refill: 1  3. Eustachian tube dysfunction, bilateral Chronic.  Controlled.  Stable.  Tympanic membranes in normal position today.  Continue Singulair 10 mg once a day. - montelukast (SINGULAIR) 10 MG tablet; Take 1 tablet (10 mg total) by mouth at  bedtime.  Dispense: 90 tablet; Refill: 3  4. Non-seasonal allergic rhinitis due to pollen As noted above. - montelukast (SINGULAIR) 10 MG tablet; Take 1 tablet (10 mg total) by mouth at bedtime.  Dispense: 90 tablet; Refill: 3  5. Seasonal allergic rhinitis due to pollen Chronic.  Controlled.  Stable.  In addition to over-the-counter Nasacort patient also takes Astelin 0.1% nasal spray twice a day. - azelastine (ASTELIN) 0.1 % nasal spray; Place 1 spray into both nostrils 2 (two) times daily. Use in each  nostril as directed  Dispense: 30 mL; Refill: 12    Otilio Miu, MD

## 2022-06-20 DIAGNOSIS — I10 Essential (primary) hypertension: Secondary | ICD-10-CM | POA: Diagnosis not present

## 2022-06-20 DIAGNOSIS — I480 Paroxysmal atrial fibrillation: Secondary | ICD-10-CM | POA: Diagnosis not present

## 2022-06-20 DIAGNOSIS — E782 Mixed hyperlipidemia: Secondary | ICD-10-CM | POA: Diagnosis not present

## 2022-07-04 DIAGNOSIS — I480 Paroxysmal atrial fibrillation: Secondary | ICD-10-CM | POA: Diagnosis not present

## 2022-07-11 DIAGNOSIS — I495 Sick sinus syndrome: Secondary | ICD-10-CM | POA: Diagnosis not present

## 2022-07-11 DIAGNOSIS — I10 Essential (primary) hypertension: Secondary | ICD-10-CM | POA: Diagnosis not present

## 2022-07-11 DIAGNOSIS — E782 Mixed hyperlipidemia: Secondary | ICD-10-CM | POA: Diagnosis not present

## 2022-07-11 DIAGNOSIS — I480 Paroxysmal atrial fibrillation: Secondary | ICD-10-CM | POA: Diagnosis not present

## 2022-08-01 DIAGNOSIS — I480 Paroxysmal atrial fibrillation: Secondary | ICD-10-CM | POA: Diagnosis not present

## 2022-08-01 DIAGNOSIS — I495 Sick sinus syndrome: Secondary | ICD-10-CM | POA: Diagnosis not present

## 2022-08-01 DIAGNOSIS — I10 Essential (primary) hypertension: Secondary | ICD-10-CM | POA: Diagnosis not present

## 2022-08-01 DIAGNOSIS — E782 Mixed hyperlipidemia: Secondary | ICD-10-CM | POA: Diagnosis not present

## 2022-08-22 DIAGNOSIS — K047 Periapical abscess without sinus: Secondary | ICD-10-CM | POA: Diagnosis not present

## 2022-10-13 DIAGNOSIS — I4819 Other persistent atrial fibrillation: Secondary | ICD-10-CM | POA: Diagnosis not present

## 2022-10-26 ENCOUNTER — Telehealth: Payer: Self-pay

## 2022-10-26 NOTE — Telephone Encounter (Signed)
Left VM asking patient to call and schedule wellness visit with NURSE A on a Wednesday MORNING.  Please schedule if patient returns call.

## 2022-11-14 DIAGNOSIS — Z01810 Encounter for preprocedural cardiovascular examination: Secondary | ICD-10-CM | POA: Diagnosis not present

## 2022-11-14 DIAGNOSIS — I471 Supraventricular tachycardia, unspecified: Secondary | ICD-10-CM | POA: Diagnosis not present

## 2022-11-14 DIAGNOSIS — Z7901 Long term (current) use of anticoagulants: Secondary | ICD-10-CM | POA: Diagnosis not present

## 2022-11-14 DIAGNOSIS — Z8679 Personal history of other diseases of the circulatory system: Secondary | ICD-10-CM | POA: Diagnosis not present

## 2022-11-14 DIAGNOSIS — I480 Paroxysmal atrial fibrillation: Secondary | ICD-10-CM | POA: Diagnosis not present

## 2022-11-15 ENCOUNTER — Ambulatory Visit (INDEPENDENT_AMBULATORY_CARE_PROVIDER_SITE_OTHER): Payer: Medicare HMO

## 2022-11-15 VITALS — Ht 72.0 in | Wt 213.0 lb

## 2022-11-15 DIAGNOSIS — I480 Paroxysmal atrial fibrillation: Secondary | ICD-10-CM | POA: Diagnosis not present

## 2022-11-15 DIAGNOSIS — I10 Essential (primary) hypertension: Secondary | ICD-10-CM | POA: Diagnosis not present

## 2022-11-15 DIAGNOSIS — R5383 Other fatigue: Secondary | ICD-10-CM | POA: Diagnosis not present

## 2022-11-15 DIAGNOSIS — I422 Other hypertrophic cardiomyopathy: Secondary | ICD-10-CM | POA: Diagnosis not present

## 2022-11-15 DIAGNOSIS — Z0000 Encounter for general adult medical examination without abnormal findings: Secondary | ICD-10-CM

## 2022-11-15 NOTE — Progress Notes (Cosign Needed Addendum)
Subjective:   Angel Cummings. is a 77 y.o. male who presents for Medicare Annual/Subsequent preventive examination.  Visit Complete: Virtual  I connected with  [name] on 11/15/22 by a audio enabled telemedicine application and verified that I am speaking with the correct person using two identifiers.  Patient Location: Home  Provider Location: Office/Clinic  I discussed the limitations of evaluation and management by telemedicine. The patient expressed understanding and agreed to proceed.      Cardiac Risk Factors include: advanced age (>32men, >3 women);male gender;hypertension;dyslipidemia     Objective:    Today's Vitals   11/15/22 0806  Weight: 213 lb (96.6 kg)  Height: 6' (1.829 m)   Body mass index is 28.89 kg/m.     11/15/2022    8:48 AM 02/14/2021    8:28 AM 02/11/2020    8:11 AM 12/18/2019    7:49 AM  Advanced Directives  Does Patient Have a Medical Advance Directive? No No No No  Would patient like information on creating a medical advance directive? No - Patient declined No - Patient declined No - Patient declined No - Patient declined    Current Medications (verified) Outpatient Encounter Medications as of 11/15/2022  Medication Sig   acetaminophen (TYLENOL) 650 MG CR tablet Take 1,300 mg by mouth every 8 (eight) hours as needed for pain.   amiodarone (PACERONE) 200 MG tablet Take 200 mg by mouth daily.   amLODipine (NORVASC) 10 MG tablet Take 1 tablet (10 mg total) by mouth daily.   apixaban (ELIQUIS) 5 MG TABS tablet Take 1 tablet by mouth 2 (two) times daily. cardio   aspirin 81 MG tablet Take 1 tablet (81 mg total) by mouth daily.   azelastine (ASTELIN) 0.1 % nasal spray Place 1 spray into both nostrils 2 (two) times daily. Use in each nostril as directed   Ca Carbonate-Mag Hydroxide (ROLAIDS PO) Take by mouth as needed.   cetirizine (ZYRTEC) 10 MG tablet Take 10 mg by mouth at bedtime.   diphenhydrAMINE (BENADRYL) 25 MG tablet Take 25 mg  by mouth every 6 (six) hours as needed.   losartan (COZAAR) 100 MG tablet Take 1 tablet (100 mg total) by mouth daily.   montelukast (SINGULAIR) 10 MG tablet Take 1 tablet (10 mg total) by mouth at bedtime.   rosuvastatin (CRESTOR) 10 MG tablet Take 1 tablet (10 mg total) by mouth once a week.   triamcinolone (NASACORT) 55 MCG/ACT AERO nasal inhaler Place 2 sprays into the nose daily.   No facility-administered encounter medications on file as of 11/15/2022.    Allergies (verified) Amoxicillin and Etodolac   History: Past Medical History:  Diagnosis Date   Arthritis    Carpal tunnel syndrome on both sides    GERD (gastroesophageal reflux disease)    Hyperlipidemia    Hypertension    Wears hearing aid in both ears    Has.  Does not wear   Past Surgical History:  Procedure Laterality Date   COLONOSCOPY  2005   Dr Ether Griffins in [facility]   COLONOSCOPY WITH PROPOFOL N/A 12/18/2019   Procedure: COLONOSCOPY WITH PROPOFOL;  Surgeon: Midge Minium, MD;  Location: [facility];  Service: Endoscopy;  Laterality: N/A;   HERNIA REPAIR     KNEE SURGERY Left    History reviewed. No pertinent family history. Social History   Socioeconomic History   Marital status: Significant Other    Spouse name: Not on file   Number of children: 1  Years of education: Not on file   Highest education level: Not on file  Occupational History   Not on file  Tobacco Use   Smoking status: Never   Smokeless tobacco: Never  Vaping Use   Vaping Use: Never used  Substance and Sexual Activity   Alcohol use: Yes    Alcohol/week: 2.0 standard drinks of alcohol    Types: 2 Cans of beer per week    Comment: 2-3 times per week   Drug use: No   Sexual activity: Not Currently  Other Topics Concern   Not on file  Social History Narrative   Lives with significant other   Social Determinants of Health   Financial Resource Strain: Low Risk  (11/15/2022)   Overall Financial Resource Strain (CARDIA)     Difficulty of Paying Living Expenses: Not hard at all  Food Insecurity: No Food Insecurity (11/15/2022)   Hunger Vital Sign    Worried About Running Out of Food in the Last Year: Never true    Ran Out of Food in the Last Year: Never true  Transportation Needs: No Transportation Needs (11/15/2022)   PRAPARE - Administrator, Civil Service (Medical): No    Lack of Transportation (Non-Medical): No  Physical Activity: Sufficiently Active (11/15/2022)   Exercise Vital Sign    Days of Exercise per Week: 5 days    Minutes of Exercise per Session: 40 min  Stress: No Stress Concern Present (11/15/2022)   [employer] of Occupational Health - Occupational Stress Questionnaire    Feeling of Stress : Not at all  Social Connections: Moderately Isolated (11/15/2022)   Social Connection and Isolation Panel [NHANES]    Frequency of Communication with Friends and Family: More than three times a week    Frequency of Social Gatherings with Friends and Family: More than three times a week    Attends Religious Services: Never    Database administrator or Organizations: No    Attends Engineer, structural: Never    Marital Status: Living with partner    Tobacco Counseling Counseling given: Yes   Clinical Intake:  Pre-visit preparation completed: No  Pain : No/denies pain     BMI - recorded: 28.89 Nutritional Status: BMI 25 -29 Overweight Nutritional Risks: None Diabetes: No  How often do you need to have someone help you when you read instructions, pamphlets, or other written materials from your doctor or pharmacy?: 1 - Never  Interpreter Needed?: No  Information entered by :: Arthur Holms   Activities of Daily Living    11/15/2022    8:15 AM  In your present state of health, do you have any difficulty performing the following activities:  Hearing? 1  Vision? 0  Difficulty concentrating or making decisions? 0  Walking or climbing stairs? 0  Dressing or bathing? 0   Doing errands, shopping? 0  Preparing Food and eating ? N  Using the Toilet? N  In the past six months, have you accidently leaked urine? N  Do you have problems with loss of bowel control? N  Managing your Medications? N  Managing your Finances? N  Housekeeping or managing your Housekeeping? N    Patient Care Team: Duanne Limerick, MD as PCP - General (Family Medicine) Jesusita Oka, MD (Dermatology)  Indicate any recent Medical Services you may have received from other than [facility] providers in the past year (date may be approximate).     Assessment:   This is a  routine wellness examination for [place].  Hearing/Vision screen Hearing Screening - Comments:: Hearing difficulties Vision Screening - Comments:: Getting eye exam  Dietary issues and exercise activities discussed:     Goals Addressed               This Visit's Progress     Exercise 3x per week (30 min per time) (pt-stated)        "Get ablation behind me"       Depression Screen    11/15/2022    8:13 AM 06/06/2022    1:46 PM 03/28/2022    1:50 PM 02/28/2022    3:56 PM 12/27/2021    1:23 PM 11/15/2021    1:27 PM 05/10/2021    8:26 AM  PHQ 2/9 Scores  PHQ - 2 Score 0 0 0 0 0 0 0  PHQ- 9 Score  0 0 0 3 3 5     Fall Risk    11/15/2022    8:49 AM 06/06/2022    1:45 PM 03/28/2022    1:50 PM 02/28/2022    3:56 PM 12/27/2021    1:22 PM  Fall Risk   Falls in the past year?  0 0 0 0  Number falls in past yr:  0 0 0 0  Injury with Fall?  0 0 0 0  Risk for fall due to : No Fall Risks No Fall Risks No Fall Risks No Fall Risks No Fall Risks  Follow up Falls evaluation completed Falls evaluation completed Falls evaluation completed Falls evaluation completed Falls evaluation completed    MEDICARE RISK AT HOME:  Medicare Risk at Home - 11/15/22 0850     Any stairs in or around the home? No    If so, are there any without handrails? No    Home free of loose throw rugs in walkways, pet beds, electrical  cords, etc? Yes    Adequate lighting in your home to reduce risk of falls? Yes    Life alert? No    Use of a cane, walker or w/c? No    Grab bars in the bathroom? Yes    Shower chair or bench in shower? No    Elevated toilet seat or a handicapped toilet? No               Cognitive Function:        11/15/2022    8:16 AM  6CIT Screen  What Year? 0 points  What month? 0 points  What time? 0 points  Count back from 20 0 points  Months in reverse 0 points  Repeat phrase 0 points  Total Score 0 points    Immunizations Immunization History  Administered Date(s) Administered   Pneumococcal Conjugate-13 06/01/2015   Pneumococcal Polysaccharide-23 05/17/2017   Tdap 06/01/2015, 12/19/2016    TDAP status: Up to date  Flu Vaccine status: Declined, Education has been provided regarding the importance of this vaccine but patient still declined. Advised may receive this vaccine at local pharmacy or Health Dept. Aware to provide a copy of the vaccination record if obtained from local pharmacy or Health Dept. Verbalized acceptance and understanding.  Pneumococcal vaccine status: Due, Education has been provided regarding the importance of this vaccine. Advised may receive this vaccine at local pharmacy or Health Dept. Aware to provide a copy of the vaccination record if obtained from local pharmacy or Health Dept. Verbalized acceptance and understanding.  Covid-19 vaccine status: Declined, Education has been provided regarding the importance of this  vaccine but patient still declined. Advised may receive this vaccine at local pharmacy or Health Dept.or vaccine clinic. Aware to provide a copy of the vaccination record if obtained from local pharmacy or Health Dept. Verbalized acceptance and understanding.  Qualifies for Shingles Vaccine? Yes   Zostavax completed No   Shingrix Completed?: No.    Education has been provided regarding the importance of this vaccine. Patient has been  advised to call insurance company to determine out of pocket expense if they have not yet received this vaccine. Advised may also receive vaccine at local pharmacy or Health Dept. Verbalized acceptance and understanding.  Screening Tests Health Maintenance  Topic Date Due   Hepatitis C Screening  12/28/2022 (Originally 01/22/1964)   Zoster Vaccines- Shingrix (1 of 2) 02/15/2023 (Originally 01/23/1965)   INFLUENZA VACCINE  12/07/2022   Medicare Annual Wellness (AWV)  11/15/2023   DTaP/Tdap/Td (3 - Td or Tdap) 12/20/2026   Colonoscopy  12/17/2029   Pneumonia Vaccine 39+ Years old  Completed   HPV VACCINES  Aged Out   COVID-19 Vaccine  Discontinued    Health Maintenance  There are no preventive care reminders to display for this patient.   Colorectal cancer screening: Type of screening: Colonoscopy. Completed 12/18/2019. Repeat every 10 years      Additional Screening:  Hepatitis C Screening: does qualify; pt does not want  Vision Screening: Recommended annual ophthalmology exams for early detection of glaucoma and other disorders of the eye. Is the patient up to date with their annual eye exam?  No  Who is the provider or what is the name of the office in which the patient attends annual eye exams? Needs appt for eye exam- wife will make If pt is not established with a provider, would they like to be referred to a provider to establish care?  Wife will make .   Dental Screening: Recommended annual dental exams for proper oral hygiene- yes- Higher education careers adviser Referral / Chronic Care Management: CRR required this visit?  No   CCM required this visit?  No     Plan:     I have personally reviewed and noted the following in the patient's chart:   Medical and social history Use of alcohol, tobacco or illicit drugs  Current medications and supplements including opioid prescriptions. Patient is not currently taking opioid prescriptions. Functional ability and  status Nutritional status Physical activity Advanced directives List of other physicians Hospitalizations, surgeries, and ER visits in previous 12 months Vitals Screenings to include cognitive, depression, and falls Referrals and appointments  In addition, I have reviewed and discussed with patient certain preventive protocols, quality metrics, and best practice recommendations. A written personalized care plan for preventive services as well as general preventive health recommendations were provided to patient.     Everitt Amber   11/15/2022   After Visit Summary: (MyChart) Due to this being a telephonic visit, the after visit summary with patients personalized plan was offered to patient via MyChart   Nurse Notes: none

## 2022-11-15 NOTE — Patient Instructions (Signed)
Angel Cummings , Thank you for taking time to come for your Medicare Wellness Visit. I appreciate your ongoing commitment to your health goals. Please review the following plan we discussed and let me know if I can assist you in the future.   These are the goals we discussed:  Goals       Exercise 3x per week (30 min per time) (pt-stated)      "Get ablation behind me"        This is a list of the screening recommended for you and due dates:  Health Maintenance  Topic Date Due   Hepatitis C Screening  12/28/2022*   Zoster (Shingles) Vaccine (1 of 2) 02/15/2023*   Flu Shot  12/07/2022   Medicare Annual Wellness Visit  11/15/2023   DTaP/Tdap/Td vaccine (3 - Td or Tdap) 12/20/2026   Colon Cancer Screening  12/17/2029   Pneumonia Vaccine  Completed   HPV Vaccine  Aged Out   COVID-19 Vaccine  Discontinued  *Topic was postponed. The date shown is not the original due date.   Health Maintenance After Age 74 After age 21, you are at a higher risk for certain long-term diseases and infections as well as injuries from falls. Falls are a major cause of broken bones and head injuries in people who are older than age 32. Getting regular preventive care can help to keep you healthy and well. Preventive care includes getting regular testing and making lifestyle changes as recommended by your health care provider. Talk with your health care provider about: Which screenings and tests you should have. A screening is a test that checks for a disease when you have no symptoms. A diet and exercise plan that is right for you. What should I know about screenings and tests to prevent falls? Screening and testing are the best ways to find a health problem early. Early diagnosis and treatment give you the best chance of managing medical conditions that are common after age 70. Certain conditions and lifestyle choices may make you more likely to have a fall. Your health care provider may recommend: Regular vision  checks. Poor vision and conditions such as cataracts can make you more likely to have a fall. If you wear glasses, make sure to get your prescription updated if your vision changes. Medicine review. Work with your health care provider to regularly review all of the medicines you are taking, including over-the-counter medicines. Ask your health care provider about any side effects that may make you more likely to have a fall. Tell your health care provider if any medicines that you take make you feel dizzy or sleepy. Strength and balance checks. Your health care provider may recommend certain tests to check your strength and balance while standing, walking, or changing positions. Foot health exam. Foot pain and numbness, as well as not wearing proper footwear, can make you more likely to have a fall. Screenings, including: Osteoporosis screening. Osteoporosis is a condition that causes the bones to get weaker and break more easily. Blood pressure screening. Blood pressure changes and medicines to control blood pressure can make you feel dizzy. Depression screening. You may be more likely to have a fall if you have a fear of falling, feel depressed, or feel unable to do activities that you used to do. Alcohol use screening. Using too much alcohol can affect your balance and may make you more likely to have a fall. Follow these instructions at home: Lifestyle Do not drink alcohol if: Your  health care provider tells you not to drink. If you drink alcohol: Limit how much you have to: 0-1 drink a day for women. 0-2 drinks a day for men. Know how much alcohol is in your drink. In the [place] one drink equals one 12 oz bottle of beer (355 mL), one 5 oz glass of wine (148 mL), or one 1 oz glass of hard liquor (44 mL). Do not use any products that contain nicotine or tobacco. These products include cigarettes, chewing tobacco, and vaping devices, such as e-cigarettes. If you need help quitting, ask your  health care provider. Activity  Follow a regular exercise program to stay fit. This will help you maintain your balance. Ask your health care provider what types of exercise are appropriate for you. If you need a cane or walker, use it as recommended by your health care provider. Wear supportive shoes that have nonskid soles. Safety  Remove any tripping hazards, such as rugs, cords, and clutter. Install safety equipment such as grab bars in bathrooms and safety rails on stairs. Keep rooms and walkways well-lit. General instructions Talk with your health care provider about your risks for falling. Tell your health care provider if: You fall. Be sure to tell your health care provider about all falls, even ones that seem minor. You feel dizzy, tiredness (fatigue), or off-balance. Take over-the-counter and prescription medicines only as told by your health care provider. These include supplements. Eat a healthy diet and maintain a healthy weight. A healthy diet includes low-fat dairy products, low-fat (lean) meats, and fiber from whole grains, beans, and lots of fruits and vegetables. Stay current with your vaccines. Schedule regular health, dental, and eye exams. Summary Having a healthy lifestyle and getting preventive care can help to protect your health and wellness after age 28. Screening and testing are the best way to find a health problem early and help you avoid having a fall. Early diagnosis and treatment give you the best chance for managing medical conditions that are more common for people who are older than age 55. Falls are a major cause of broken bones and head injuries in people who are older than age 17. Take precautions to prevent a fall at home. Work with your health care provider to learn what changes you can make to improve your health and wellness and to prevent falls. This information is not intended to replace advice given to you by your health care provider. Make sure  you discuss any questions you have with your health care provider. Document Revised: 09/13/2020 Document Reviewed: 09/13/2020 Elsevier Patient Education  2024 [employer].

## 2022-11-16 DIAGNOSIS — I422 Other hypertrophic cardiomyopathy: Secondary | ICD-10-CM | POA: Diagnosis not present

## 2022-11-16 DIAGNOSIS — I10 Essential (primary) hypertension: Secondary | ICD-10-CM | POA: Diagnosis not present

## 2022-11-16 DIAGNOSIS — R5383 Other fatigue: Secondary | ICD-10-CM | POA: Diagnosis not present

## 2022-11-16 DIAGNOSIS — I480 Paroxysmal atrial fibrillation: Secondary | ICD-10-CM | POA: Diagnosis not present

## 2022-12-05 ENCOUNTER — Ambulatory Visit: Payer: Medicare HMO | Admitting: Family Medicine

## 2022-12-12 ENCOUNTER — Ambulatory Visit (INDEPENDENT_AMBULATORY_CARE_PROVIDER_SITE_OTHER): Payer: Medicare HMO | Admitting: Family Medicine

## 2022-12-12 ENCOUNTER — Encounter: Payer: Self-pay | Admitting: Family Medicine

## 2022-12-12 VITALS — BP 120/78 | HR 76 | Ht 72.0 in | Wt 203.0 lb

## 2022-12-12 DIAGNOSIS — H6993 Unspecified Eustachian tube disorder, bilateral: Secondary | ICD-10-CM

## 2022-12-12 DIAGNOSIS — E782 Mixed hyperlipidemia: Secondary | ICD-10-CM | POA: Diagnosis not present

## 2022-12-12 DIAGNOSIS — I10 Essential (primary) hypertension: Secondary | ICD-10-CM

## 2022-12-12 DIAGNOSIS — R634 Abnormal weight loss: Secondary | ICD-10-CM

## 2022-12-12 DIAGNOSIS — D649 Anemia, unspecified: Secondary | ICD-10-CM | POA: Diagnosis not present

## 2022-12-12 DIAGNOSIS — J301 Allergic rhinitis due to pollen: Secondary | ICD-10-CM | POA: Diagnosis not present

## 2022-12-12 MED ORDER — LOSARTAN POTASSIUM 100 MG PO TABS
100.0000 mg | ORAL_TABLET | Freq: Every day | ORAL | 1 refills | Status: DC
Start: 2022-12-12 — End: 2023-09-26

## 2022-12-12 MED ORDER — MONTELUKAST SODIUM 10 MG PO TABS
10.0000 mg | ORAL_TABLET | Freq: Every day | ORAL | 3 refills | Status: AC
Start: 2022-12-12 — End: ?

## 2022-12-12 MED ORDER — ROSUVASTATIN CALCIUM 10 MG PO TABS
10.0000 mg | ORAL_TABLET | ORAL | 1 refills | Status: DC
Start: 2022-12-12 — End: 2023-06-22

## 2022-12-12 MED ORDER — AMLODIPINE BESYLATE 10 MG PO TABS
10.0000 mg | ORAL_TABLET | Freq: Every day | ORAL | 1 refills | Status: DC
Start: 2022-12-12 — End: 2023-06-28

## 2022-12-12 NOTE — Progress Notes (Signed)
Date:  12/12/2022   Name:  Angel Cummings.   DOB:  Aug 27, 1945   MRN:  [id number]   Chief Complaint: Hyperlipidemia, Hypertension, and Allergic Rhinitis   Hyperlipidemia This is a chronic problem. The current episode started more than 1 year ago. The problem is controlled. He has no history of chronic renal disease. There are no known factors aggravating his hyperlipidemia. Pertinent negatives include no chest pain, focal sensory loss, focal weakness, leg pain, myalgias or shortness of breath. Current antihyperlipidemic treatment includes statins. The current treatment provides moderate improvement of lipids. There are no compliance problems.   Hypertension This is a chronic problem. The current episode started more than 1 year ago. The problem has been gradually improving since onset. The problem is controlled. Pertinent negatives include no anxiety, chest pain, headaches, neck pain, orthopnea, palpitations, PND or shortness of breath. Risk factors for coronary artery disease include dyslipidemia. Past treatments include angiotensin blockers and calcium channel blockers. The current treatment provides moderate improvement. There are no compliance problems.  There is no history of CAD/MI or CVA. There is no history of chronic renal disease, a hypertension causing med or renovascular disease.    Lab Results  Component Value Date   NA 139 11/15/2021   K 4.0 11/15/2021   CO2 22 11/15/2021   GLUCOSE 88 11/15/2021   BUN 13 11/15/2021   CREATININE 1.01 11/15/2021   CALCIUM 9.3 11/15/2021   EGFR 79 11/15/2021   GFRNONAA 72 04/19/2020   Lab Results  Component Value Date   CHOL 211 (H) 11/15/2021   HDL 38 (L) 11/15/2021   LDLCALC 128 (H) 11/15/2021   TRIG 254 (H) 11/15/2021   CHOLHDL 5.0 06/25/2017   Lab Results  Component Value Date   TSH 1.360 11/15/2021   No results found for: "HGBA1C" Lab Results  Component Value Date   WBC 4.7 11/15/2021   HGB 14.0 11/15/2021   HCT 41.0  11/15/2021   MCV 90 11/15/2021   PLT 208 11/15/2021   Lab Results  Component Value Date   ALT 16 11/15/2021   AST 14 11/15/2021   ALKPHOS 72 11/15/2021   BILITOT 0.4 11/15/2021   No results found for: "25OHVITD2", "25OHVITD3", "VD25OH"   Review of Systems  Constitutional:  Negative for chills and fever.  HENT:  Negative for drooling, ear discharge, ear pain, rhinorrhea and sore throat.   Respiratory:  Negative for cough, shortness of breath and wheezing.   Cardiovascular:  Negative for chest pain, palpitations, orthopnea, leg swelling and PND.  Gastrointestinal:  Negative for abdominal pain, blood in stool, constipation, diarrhea and nausea.  Endocrine: Negative for polydipsia.  Genitourinary:  Negative for dysuria, frequency, hematuria and urgency.  Musculoskeletal:  Negative for back pain, myalgias and neck pain.  Skin:  Negative for rash.  Allergic/Immunologic: Negative for environmental allergies.  Neurological:  Negative for dizziness, focal weakness and headaches.  Hematological:  Does not bruise/bleed easily.  Psychiatric/Behavioral:  Negative for suicidal ideas. The patient is not nervous/anxious.     Patient Active Problem List   Diagnosis Date Noted   Lymphedema 12/18/2021   Leg weakness 12/18/2021   Screen for colon cancer    Cubital tunnel syndrome on right 07/18/2017   Radiculopathy of cervical region 07/18/2017   Acute postoperative pain 12/21/2016   Open fracture of shaft of right ulna 12/20/2016   Arthritis of both hands 04/17/2016   Arthritis of hand 04/17/2016   Essential hypertension 06/01/2015   Hyperlipidemia 06/01/2015  Allergies  Allergen Reactions   Amoxicillin Rash   Etodolac Rash and Hives    Past Surgical History:  Procedure Laterality Date   COLONOSCOPY  2005   Dr Ether Griffins in [facility]   COLONOSCOPY WITH PROPOFOL N/A 12/18/2019   Procedure: COLONOSCOPY WITH PROPOFOL;  Surgeon: Midge Minium, MD;  Location: [facility];  Service:  Endoscopy;  Laterality: N/A;   HERNIA REPAIR     KNEE SURGERY Left     Social History   Tobacco Use   Smoking status: Never   Smokeless tobacco: Never  Vaping Use   Vaping status: Never Used  Substance Use Topics   Alcohol use: Yes    Alcohol/week: 2.0 standard drinks of alcohol    Types: 2 Cans of beer per week    Comment: 2-3 times per week   Drug use: No     Medication list has been reviewed and updated.  Current Meds  Medication Sig   acetaminophen (TYLENOL) 650 MG CR tablet Take 1,300 mg by mouth every 8 (eight) hours as needed for pain.   amiodarone (PACERONE) 200 MG tablet Take 200 mg by mouth daily.   amLODipine (NORVASC) 10 MG tablet Take 1 tablet (10 mg total) by mouth daily.   apixaban (ELIQUIS) 5 MG TABS tablet Take 1 tablet by mouth 2 (two) times daily. cardio   aspirin 81 MG tablet Take 1 tablet (81 mg total) by mouth daily.   azelastine (ASTELIN) 0.1 % nasal spray Place 1 spray into both nostrils 2 (two) times daily. Use in each nostril as directed   Ca Carbonate-Mag Hydroxide (ROLAIDS PO) Take by mouth as needed.   cetirizine (ZYRTEC) 10 MG tablet Take 10 mg by mouth at bedtime.   diphenhydrAMINE (BENADRYL) 25 MG tablet Take 25 mg by mouth every 6 (six) hours as needed.   losartan (COZAAR) 100 MG tablet Take 1 tablet (100 mg total) by mouth daily.   montelukast (SINGULAIR) 10 MG tablet Take 1 tablet (10 mg total) by mouth at bedtime.   rosuvastatin (CRESTOR) 10 MG tablet Take 1 tablet (10 mg total) by mouth once a week.   triamcinolone (NASACORT) 55 MCG/ACT AERO nasal inhaler Place 2 sprays into the nose daily.       06/06/2022    1:46 PM 03/28/2022    1:51 PM 02/28/2022    3:57 PM 12/27/2021    1:24 PM  GAD 7 : Generalized Anxiety Score  Nervous, Anxious, on Edge 0 0 0 0  Control/stop worrying 0 0 0 0  Worry too much - different things 0 0 0 0  Trouble relaxing 0 0 0 0  Restless 0 0 0 0  Easily annoyed or irritable 0 0 0 0  Afraid - awful might  happen 0 0 0 0  Total GAD 7 Score 0 0 0 0  Anxiety Difficulty Not difficult at all Not difficult at all Not difficult at all Not difficult at all       12/12/2022    9:04 AM 11/15/2022    8:13 AM 06/06/2022    1:46 PM  Depression screen PHQ 2/9  Decreased Interest 0 0 0  Down, Depressed, Hopeless 0 0 0  PHQ - 2 Score 0 0 0  Altered sleeping 0  0  Tired, decreased energy 0  0  Change in appetite 0  0  Feeling bad or failure about yourself  0  0  Trouble concentrating 0  0  Moving slowly or fidgety/restless 0  0  Suicidal thoughts 0  0  PHQ-9 Score 0  0  Difficult doing work/chores Not difficult at all  Not difficult at all    BP Readings from Last 3 Encounters:  12/12/22 120/78  06/06/22 137/76  03/28/22 130/60    Physical Exam Vitals and nursing note reviewed.  HENT:     Head: Normocephalic.     Right Ear: Tympanic membrane and external ear normal.     Left Ear: Tympanic membrane and external ear normal.     Nose: Nose normal. No congestion or rhinorrhea.  Eyes:     General: No scleral icterus.       Right eye: No discharge.        Left eye: No discharge.     Conjunctiva/sclera: Conjunctivae normal.     Pupils: Pupils are equal, round, and reactive to light.  Neck:     Thyroid: No thyromegaly.     Vascular: No JVD.     Trachea: No tracheal deviation.  Cardiovascular:     Rate and Rhythm: Normal rate and regular rhythm.     Heart sounds: Normal heart sounds. No murmur heard.    No friction rub. No gallop.  Pulmonary:     Effort: No respiratory distress.     Breath sounds: Normal breath sounds. No wheezing or rales.  Abdominal:     General: Bowel sounds are normal.     Palpations: Abdomen is soft. There is no hepatomegaly, splenomegaly or mass.     Tenderness: There is no abdominal tenderness. There is no right CVA tenderness, left CVA tenderness, guarding or rebound.  Musculoskeletal:        General: No tenderness. Normal range of motion.     Cervical back:  Normal range of motion and neck supple.  Lymphadenopathy:     Cervical: No cervical adenopathy.  Skin:    General: Skin is warm.     Findings: No rash.  Neurological:     Mental Status: He is alert and oriented to person, place, and time.     Cranial Nerves: No cranial nerve deficit.     Deep Tendon Reflexes: Reflexes are normal and symmetric.     Wt Readings from Last 3 Encounters:  12/12/22 203 lb (92.1 kg)  11/15/22 213 lb (96.6 kg)  06/06/22 213 lb (96.6 kg)    BP 120/78   Pulse 76   Ht 6' (1.829 m)   Wt 203 lb (92.1 kg)   SpO2 96%   BMI 27.53 kg/m   Assessment and Plan: 1. Essential hypertension Chronic.  Controlled.  Stable.  Asymptomatic.  Tolerating medications well.  Today's blood pressure 120/78.  Continue amlodipine 10 mg once a day and losartan 100 mg once a day.  Will check CMP for electrolytes and GFR as well as albumin and total protein. - amLODipine (NORVASC) 10 MG tablet; Take 1 tablet (10 mg total) by mouth daily.  Dispense: 90 tablet; Refill: 1 - losartan (COZAAR) 100 MG tablet; Take 1 tablet (100 mg total) by mouth daily.  Dispense: 90 tablet; Refill: 1 - Comprehensive Metabolic Panel (CMET)  2. Mixed hyperlipidemia Chronic.  Controlled.  Stable.  Tolerating rosuvastatin 10 mg once a week.  Will check lipid panel current level of control. - rosuvastatin (CRESTOR) 10 MG tablet; Take 1 tablet (10 mg total) by mouth once a week.  Dispense: 12 tablet; Refill: 1 - Lipid Panel With LDL/HDL Ratio  3. Weight loss, non-intentional Patient has had a 10 pound weight  loss since the first of the year.  He attributed this to a lack of appetite and not eating as much.  There has been no change in his dietary including they do a lot of grilling.  Perhaps he is eating better but we will look at some other issues including thyroid panel. - Comprehensive Metabolic Panel (CMET) - Thyroid Panel With TSH  4. Eustachian tube dysfunction, bilateral Chronic.  Controlled.   Stable.  Continue montelukast 10 mg once a day. - montelukast (SINGULAIR) 10 MG tablet; Take 1 tablet (10 mg total) by mouth at bedtime.  Dispense: 90 tablet; Refill: 3  5. Non-seasonal allergic rhinitis due to pollen Chronic.  Controlled.  Stable.  Seasonal allergies we controlled with Singulair 10 mg once a day. - montelukast (SINGULAIR) 10 MG tablet; Take 1 tablet (10 mg total) by mouth at bedtime.  Dispense: 90 tablet; Refill: 3  6. Anemia, unspecified type Review of [facility] that was [facility] at [facility] noted that there is an anemia and we will check B12 folic acid and iron panel.  This is a mild decrease in indices are normocytic.  Patient has been given guaiac cards to check for blood in the stools on 3 different bowel movements to see if on review of his colonoscopy patient has several wid[place] diverticula that may be due to chronic blood loss from the of the GI tract.  We will recheck in 3 months both for the anemia and the weight loss.  Patient also brought up the fact that someone told him that he had sarcoidosis but on review of his cardiac there is mention of amyloidosis.  I offered to do an ultrasound of his liver but patient suggested that I was making him a "[place] pig "and that he would prefer to let things go and not do further evaluation. - B12 and Folate Panel - Iron, TIBC and Ferritin Panel     Elizabeth Sauer, MD

## 2022-12-13 LAB — IRON,TIBC AND FERRITIN PANEL
Ferritin: 521 ng/mL — ABNORMAL HIGH (ref 30–400)
Iron Saturation: 37 % (ref 15–55)
Iron: 92 ug/dL (ref 38–169)
Total Iron Binding Capacity: 251 ug/dL (ref 250–450)
UIBC: 159 ug/dL (ref 111–343)

## 2022-12-13 LAB — B12 AND FOLATE PANEL
Folate: 8.8 ng/mL
Vitamin B-12: 658 pg/mL (ref 232–1245)

## 2022-12-29 DIAGNOSIS — I495 Sick sinus syndrome: Secondary | ICD-10-CM | POA: Diagnosis not present

## 2022-12-29 DIAGNOSIS — I517 Cardiomegaly: Secondary | ICD-10-CM | POA: Diagnosis not present

## 2022-12-29 DIAGNOSIS — I480 Paroxysmal atrial fibrillation: Secondary | ICD-10-CM | POA: Diagnosis not present

## 2023-01-04 DIAGNOSIS — I495 Sick sinus syndrome: Secondary | ICD-10-CM | POA: Diagnosis not present

## 2023-01-04 DIAGNOSIS — I480 Paroxysmal atrial fibrillation: Secondary | ICD-10-CM | POA: Diagnosis not present

## 2023-01-22 DIAGNOSIS — Z9889 Other specified postprocedural states: Secondary | ICD-10-CM | POA: Diagnosis not present

## 2023-01-22 DIAGNOSIS — Z8679 Personal history of other diseases of the circulatory system: Secondary | ICD-10-CM | POA: Diagnosis not present

## 2023-01-22 DIAGNOSIS — I493 Ventricular premature depolarization: Secondary | ICD-10-CM | POA: Diagnosis not present

## 2023-01-22 DIAGNOSIS — I495 Sick sinus syndrome: Secondary | ICD-10-CM | POA: Diagnosis not present

## 2023-01-22 DIAGNOSIS — I471 Supraventricular tachycardia, unspecified: Secondary | ICD-10-CM | POA: Diagnosis not present

## 2023-01-22 DIAGNOSIS — I480 Paroxysmal atrial fibrillation: Secondary | ICD-10-CM | POA: Diagnosis not present

## 2023-02-01 DIAGNOSIS — I480 Paroxysmal atrial fibrillation: Secondary | ICD-10-CM | POA: Diagnosis not present

## 2023-02-01 DIAGNOSIS — I517 Cardiomegaly: Secondary | ICD-10-CM | POA: Diagnosis not present

## 2023-02-01 DIAGNOSIS — Z8679 Personal history of other diseases of the circulatory system: Secondary | ICD-10-CM | POA: Diagnosis not present

## 2023-02-06 DIAGNOSIS — I480 Paroxysmal atrial fibrillation: Secondary | ICD-10-CM | POA: Diagnosis not present

## 2023-02-06 DIAGNOSIS — E854 Organ-limited amyloidosis: Secondary | ICD-10-CM | POA: Diagnosis not present

## 2023-02-06 DIAGNOSIS — E782 Mixed hyperlipidemia: Secondary | ICD-10-CM | POA: Diagnosis not present

## 2023-02-06 DIAGNOSIS — Z7901 Long term (current) use of anticoagulants: Secondary | ICD-10-CM | POA: Diagnosis not present

## 2023-02-06 DIAGNOSIS — E859 Amyloidosis, unspecified: Secondary | ICD-10-CM | POA: Diagnosis not present

## 2023-02-06 DIAGNOSIS — I5032 Chronic diastolic (congestive) heart failure: Secondary | ICD-10-CM | POA: Diagnosis not present

## 2023-02-06 DIAGNOSIS — I43 Cardiomyopathy in diseases classified elsewhere: Secondary | ICD-10-CM | POA: Diagnosis not present

## 2023-02-06 DIAGNOSIS — I10 Essential (primary) hypertension: Secondary | ICD-10-CM | POA: Diagnosis not present

## 2023-03-27 DIAGNOSIS — I43 Cardiomyopathy in diseases classified elsewhere: Secondary | ICD-10-CM | POA: Diagnosis not present

## 2023-03-27 DIAGNOSIS — I480 Paroxysmal atrial fibrillation: Secondary | ICD-10-CM | POA: Diagnosis not present

## 2023-03-27 DIAGNOSIS — Z79899 Other long term (current) drug therapy: Secondary | ICD-10-CM | POA: Diagnosis not present

## 2023-03-27 DIAGNOSIS — E854 Organ-limited amyloidosis: Secondary | ICD-10-CM | POA: Diagnosis not present

## 2023-03-27 DIAGNOSIS — I110 Hypertensive heart disease with heart failure: Secondary | ICD-10-CM | POA: Diagnosis not present

## 2023-03-27 DIAGNOSIS — Z7901 Long term (current) use of anticoagulants: Secondary | ICD-10-CM | POA: Diagnosis not present

## 2023-03-27 DIAGNOSIS — E782 Mixed hyperlipidemia: Secondary | ICD-10-CM | POA: Diagnosis not present

## 2023-03-27 DIAGNOSIS — I4719 Other supraventricular tachycardia: Secondary | ICD-10-CM | POA: Diagnosis not present

## 2023-03-27 DIAGNOSIS — I5032 Chronic diastolic (congestive) heart failure: Secondary | ICD-10-CM | POA: Diagnosis not present

## 2023-03-27 DIAGNOSIS — I471 Supraventricular tachycardia, unspecified: Secondary | ICD-10-CM | POA: Diagnosis not present

## 2023-04-19 ENCOUNTER — Ambulatory Visit: Payer: Medicare HMO | Admitting: Family Medicine

## 2023-04-19 ENCOUNTER — Telehealth: Payer: Self-pay

## 2023-04-19 ENCOUNTER — Other Ambulatory Visit: Payer: Self-pay

## 2023-04-19 ENCOUNTER — Other Ambulatory Visit: Payer: Self-pay | Admitting: Family Medicine

## 2023-04-19 DIAGNOSIS — E785 Hyperlipidemia, unspecified: Secondary | ICD-10-CM

## 2023-04-19 DIAGNOSIS — I10 Essential (primary) hypertension: Secondary | ICD-10-CM

## 2023-04-19 NOTE — Telephone Encounter (Signed)
Pt had an appt today at 8:20 AM today. Pt checked in a few mins late. Pt told the front desk that he needs to go to the bathroom to not call him back and that it will take a while. Pt came back 10-15 mine later. We rescheduled the pts appt. I did get the pts weight which was 199 lbs due to pt coming in for weight change. Labs were ordered. Told pt to completed 3 stool cards. Pt declined.  KP

## 2023-04-20 ENCOUNTER — Encounter: Payer: Self-pay | Admitting: Family Medicine

## 2023-04-20 LAB — CBC WITH DIFFERENTIAL/PLATELET
Basophils Absolute: 0 10*3/uL (ref 0.0–0.2)
Basos: 1 %
EOS (ABSOLUTE): 0.2 10*3/uL (ref 0.0–0.4)
Eos: 5 %
Hematocrit: 45.4 % (ref 37.5–51.0)
Hemoglobin: 15 g/dL (ref 13.0–17.7)
Immature Grans (Abs): 0 10*3/uL (ref 0.0–0.1)
Immature Granulocytes: 0 %
Lymphocytes Absolute: 1.3 10*3/uL (ref 0.7–3.1)
Lymphs: 28 %
MCH: 30.9 pg (ref 26.6–33.0)
MCHC: 33 g/dL (ref 31.5–35.7)
MCV: 93 fL (ref 79–97)
Monocytes Absolute: 0.5 10*3/uL (ref 0.1–0.9)
Monocytes: 10 %
Neutrophils Absolute: 2.7 10*3/uL (ref 1.4–7.0)
Neutrophils: 56 %
Platelets: 218 10*3/uL (ref 150–450)
RBC: 4.86 x10E6/uL (ref 4.14–5.80)
RDW: 12.8 % (ref 11.6–15.4)
WBC: 4.8 10*3/uL (ref 3.4–10.8)

## 2023-04-20 LAB — COMPREHENSIVE METABOLIC PANEL
ALT: 16 [IU]/L (ref 0–44)
AST: 15 [IU]/L (ref 0–40)
Albumin: 4.3 g/dL (ref 3.8–4.8)
Alkaline Phosphatase: 87 [IU]/L (ref 44–121)
BUN/Creatinine Ratio: 9 — ABNORMAL LOW (ref 10–24)
BUN: 11 mg/dL (ref 8–27)
Bilirubin Total: 0.5 mg/dL (ref 0.0–1.2)
CO2: 24 mmol/L (ref 20–29)
Calcium: 9.7 mg/dL (ref 8.6–10.2)
Chloride: 108 mmol/L — ABNORMAL HIGH (ref 96–106)
Creatinine, Ser: 1.2 mg/dL (ref 0.76–1.27)
Globulin, Total: 2.3 g/dL (ref 1.5–4.5)
Glucose: 100 mg/dL — ABNORMAL HIGH (ref 70–99)
Potassium: 4.3 mmol/L (ref 3.5–5.2)
Sodium: 147 mmol/L — ABNORMAL HIGH (ref 134–144)
Total Protein: 6.6 g/dL (ref 6.0–8.5)
eGFR: 63 mL/min/{1.73_m2} (ref >59)

## 2023-05-04 ENCOUNTER — Encounter: Payer: Self-pay | Admitting: Family Medicine

## 2023-05-04 ENCOUNTER — Ambulatory Visit (INDEPENDENT_AMBULATORY_CARE_PROVIDER_SITE_OTHER): Payer: Medicare HMO | Admitting: Family Medicine

## 2023-05-04 VITALS — BP 130/80 | HR 77 | Ht 72.0 in | Wt 197.4 lb

## 2023-05-04 DIAGNOSIS — R634 Abnormal weight loss: Secondary | ICD-10-CM | POA: Diagnosis not present

## 2023-05-04 DIAGNOSIS — R5383 Other fatigue: Secondary | ICD-10-CM

## 2023-05-04 NOTE — Progress Notes (Signed)
Date:  05/04/2023   Name:  Atharv Assis.   DOB:  [birth year]   MRN:  [id number]   Chief Complaint: Weight Loss and Anemia  Anemia Presents for follow-up visit. Symptoms include malaise/fatigue and weight loss. There has been no abdominal pain, anorexia, bruising/bleeding easily, confusion, fever, leg swelling, light-headedness, palpitations or paresthesias. Signs of blood loss that are not present include hematemesis, hematochezia and melena. There are no compliance problems.  Side effects of medications include fatigue.    Lab Results  Component Value Date   NA 147 (H) 04/19/2023   K 4.3 04/19/2023   CO2 24 04/19/2023   GLUCOSE 100 (H) 04/19/2023   BUN 11 04/19/2023   CREATININE 1.20 04/19/2023   CALCIUM 9.7 04/19/2023   EGFR 63 04/19/2023   GFRNONAA 72 04/19/2020   Lab Results  Component Value Date   CHOL 185 12/12/2022   HDL 38 (L) 12/12/2022   LDLCALC 121 (H) 12/12/2022   TRIG 145 12/12/2022   CHOLHDL 5.0 06/25/2017   Lab Results  Component Value Date   TSH 1.200 12/12/2022   No results found for: "HGBA1C" Lab Results  Component Value Date   WBC 4.8 04/19/2023   HGB 15.0 04/19/2023   HCT 45.4 04/19/2023   MCV 93 04/19/2023   PLT 218 04/19/2023   Lab Results  Component Value Date   ALT 16 04/19/2023   AST 15 04/19/2023   ALKPHOS 87 04/19/2023   BILITOT 0.5 04/19/2023   No results found for: "25OHVITD2", "25OHVITD3", "VD25OH"   Review of Systems  Constitutional:  Positive for malaise/fatigue and weight loss. Negative for chills and fever.  HENT:  Negative for drooling, ear discharge, ear pain and sore throat.   Respiratory:  Negative for cough, shortness of breath and wheezing.   Cardiovascular:  Negative for chest pain, palpitations and leg swelling.  Gastrointestinal:  Negative for abdominal pain, anal bleeding, anorexia, blood in stool, constipation, diarrhea, hematemesis, hematochezia, melena and nausea.  Endocrine: Negative for polydipsia and  polyphagia.  Genitourinary:  Negative for dysuria, frequency, hematuria and urgency.  Musculoskeletal:  Negative for back pain, myalgias and neck pain.  Skin:  Negative for rash.  Allergic/Immunologic: Negative for environmental allergies.  Neurological:  Negative for dizziness, light-headedness, headaches and paresthesias.  Hematological:  Does not bruise/bleed easily.  Psychiatric/Behavioral:  Negative for confusion and suicidal ideas. The patient is not nervous/anxious.     Patient Active Problem List   Diagnosis Date Noted   Lymphedema 12/18/2021   Leg weakness 12/18/2021   Screen for colon cancer    Cubital tunnel syndrome on right 07/18/2017   Radiculopathy of cervical region 07/18/2017   Acute postoperative pain 12/21/2016   Open fracture of shaft of right ulna 12/20/2016   Arthritis of both hands 04/17/2016   Arthritis of hand 04/17/2016   Essential hypertension 06/01/2015   Hyperlipidemia 06/01/2015    Allergies  Allergen Reactions   Amiodarone Other (See Comments)    Patient felt like he was standing on ice   Amoxicillin Rash   Etodolac Rash and Hives    Past Surgical History:  Procedure Laterality Date   COLONOSCOPY  2005   Dr Ether Griffins in [facility]   COLONOSCOPY WITH PROPOFOL N/A 12/18/2019   Procedure: COLONOSCOPY WITH PROPOFOL;  Surgeon: Midge Minium, MD;  Location: [facility];  Service: Endoscopy;  Laterality: N/A;   HERNIA REPAIR     KNEE SURGERY Left     Social History   Tobacco Use  Smoking status: Never   Smokeless tobacco: Never  Vaping Use   Vaping status: Never Used  Substance Use Topics   Alcohol use: Yes    Alcohol/week: 2.0 standard drinks of alcohol    Types: 2 Cans of beer per week    Comment: 2-3 times per week   Drug use: No     Medication list has been reviewed and updated.  Current Meds  Medication Sig   acetaminophen (TYLENOL) 650 MG CR tablet Take 1,300 mg by mouth every 8 (eight) hours as needed for pain.    amiodarone (PACERONE) 200 MG tablet Take 200 mg by mouth daily.   amLODipine (NORVASC) 10 MG tablet Take 1 tablet (10 mg total) by mouth daily.   apixaban (ELIQUIS) 5 MG TABS tablet Take 1 tablet by mouth 2 (two) times daily. cardio   aspirin 81 MG tablet Take 1 tablet (81 mg total) by mouth daily.   azelastine (ASTELIN) 0.1 % nasal spray Place 1 spray into both nostrils 2 (two) times daily. Use in each nostril as directed   Ca Carbonate-Mag Hydroxide (ROLAIDS PO) Take by mouth as needed.   cetirizine (ZYRTEC) 10 MG tablet Take 10 mg by mouth at bedtime.   diphenhydrAMINE (BENADRYL) 25 MG tablet Take 25 mg by mouth every 6 (six) hours as needed.   losartan (COZAAR) 100 MG tablet Take 1 tablet (100 mg total) by mouth daily.   montelukast (SINGULAIR) 10 MG tablet Take 1 tablet (10 mg total) by mouth at bedtime.   rosuvastatin (CRESTOR) 10 MG tablet Take 1 tablet (10 mg total) by mouth once a week.   triamcinolone (NASACORT) 55 MCG/ACT AERO nasal inhaler Place 2 sprays into the nose daily.       05/04/2023    9:35 AM 06/06/2022    1:46 PM 03/28/2022    1:51 PM 02/28/2022    3:57 PM  GAD 7 : Generalized Anxiety Score  Nervous, Anxious, on Edge 0 0 0 0  Control/stop worrying 0 0 0 0  Worry too much - different things 0 0 0 0  Trouble relaxing 0 0 0 0  Restless 0 0 0 0  Easily annoyed or irritable 0 0 0 0  Afraid - awful might happen 0 0 0 0  Total GAD 7 Score 0 0 0 0  Anxiety Difficulty Not difficult at all Not difficult at all Not difficult at all Not difficult at all       05/04/2023    9:35 AM 12/12/2022    9:04 AM 11/15/2022    8:13 AM  Depression screen PHQ 2/9  Decreased Interest 0 0 0  Down, Depressed, Hopeless 0 0 0  PHQ - 2 Score 0 0 0  Altered sleeping 3 0   Tired, decreased energy 3 0   Change in appetite 0 0   Feeling bad or failure about yourself  0 0   Trouble concentrating 0 0   Moving slowly or fidgety/restless 0 0   Suicidal thoughts 0 0   PHQ-9 Score 6 0    Difficult doing work/chores Not difficult at all Not difficult at all     BP Readings from Last 3 Encounters:  05/04/23 130/80  12/12/22 120/78  06/06/22 137/76    Physical Exam Vitals and nursing note reviewed.  HENT:     Head: Normocephalic.     Right Ear: External ear normal.     Left Ear: External ear normal.     Nose: Nose normal.  Eyes:     General: No scleral icterus.       Right eye: No discharge.        Left eye: No discharge.     Conjunctiva/sclera: Conjunctivae normal.     Pupils: Pupils are equal, round, and reactive to light.  Neck:     Thyroid: No thyromegaly.     Vascular: No JVD.     Trachea: No tracheal deviation.  Cardiovascular:     Rate and Rhythm: Normal rate and regular rhythm.     Heart sounds: Normal heart sounds. No murmur heard.    No friction rub. No gallop.  Pulmonary:     Effort: No respiratory distress.     Breath sounds: Normal breath sounds. No wheezing or rales.  Abdominal:     General: Bowel sounds are normal.     Palpations: Abdomen is soft. There is no mass.     Tenderness: There is no abdominal tenderness. There is no guarding or rebound.  Genitourinary:    Comments: Patient refuses prostate exam Musculoskeletal:        General: No tenderness. Normal range of motion.     Cervical back: Normal range of motion and neck supple.  Lymphadenopathy:     Cervical: No cervical adenopathy.  Skin:    General: Skin is warm.     Findings: No rash.  Neurological:     Mental Status: He is alert and oriented to person, place, and time.     Cranial Nerves: No cranial nerve deficit.     Deep Tendon Reflexes: Reflexes are normal and symmetric.     Wt Readings from Last 3 Encounters:  05/04/23 197 lb 6.4 oz (89.5 kg)  12/12/22 203 lb (92.1 kg)  11/15/22 213 lb (96.6 kg)    BP 130/80 (BP Location: Right Arm, Cuff Size: Large)   Pulse 77   Ht 6' (1.829 m)   Wt 197 lb 6.4 oz (89.5 kg)   SpO2 97%   BMI 26.77 kg/m   Assessment and  Plan:  1. Weight loss, non-intentional (Primary) Chronic.  Episodic.  Patient continues to have gradual weight loss the most recent of about 6 pounds over 4 to 5 months.  Patient relates no cough or difficulty breathing, abdominal pain or blood per rectum, no or any symptomatology that would suggest an imaging procedure for diagnostics.  Labs have been done in the relatively recent past and we will obtain labs that have not been done most recently including thyroid panel with TSH, sed rate, hepatic function panel and PSA.  Pending lab results may dictate in which direction we go from here we have also discussed that depression is a diagnosis of exclusion after we have ruled out other areas of concern that could cause weight loss.  Patient has refused rectal exam for evaluation of prostate and there is a concern because there is a change in the bowel shape that is becoming thinner and this may be due to the prostate particularly since he has had a relative recent colonoscopy in 2021. - Thyroid Panel With TSH - Sedimentation rate - Hepatic Function Panel (6) - PSA  2. Fatigue, unspecified type Patient has new onset fatigue which is affected his appetite which in turn has caused him to lose more weight.  We will initiate evaluation but depression also becomes a concern as does the prostate above and depression is a diagnosis of exclusion but patient is reluctant to go on any other medications so I am  not sure what overall we may do with this concern other than to discussed with patient in the future. - Thyroid Panel With TSH - Sedimentation rate - Hepatic Function Panel (6) - PSA    Elizabeth Sauer, MD

## 2023-05-05 ENCOUNTER — Encounter: Payer: Self-pay | Admitting: Family Medicine

## 2023-05-05 LAB — HEPATIC FUNCTION PANEL (6)
ALT: 15 [IU]/L (ref 0–44)
AST: 14 [IU]/L (ref 0–40)
Albumin: 4.4 g/dL (ref 3.8–4.8)
Alkaline Phosphatase: 83 [IU]/L (ref 44–121)
Bilirubin Total: 0.6 mg/dL (ref 0.0–1.2)
Bilirubin, Direct: 0.18 mg/dL (ref 0.00–0.40)

## 2023-05-05 LAB — SEDIMENTATION RATE: Sed Rate: 2 mm/h (ref 0–30)

## 2023-05-05 LAB — THYROID PANEL WITH TSH
Free Thyroxine Index: 1.2 (ref 1.2–4.9)
T3 Uptake Ratio: 27 % (ref 24–39)
T4, Total: 4.6 ug/dL (ref 4.5–12.0)
TSH: 1.06 u[IU]/mL (ref 0.450–4.500)

## 2023-05-05 LAB — PSA: Prostate Specific Ag, Serum: 1.5 ng/mL (ref 0.0–4.0)

## 2023-06-22 ENCOUNTER — Ambulatory Visit: Payer: Medicare HMO | Admitting: Family Medicine

## 2023-06-22 ENCOUNTER — Encounter: Payer: Self-pay | Admitting: Family Medicine

## 2023-06-22 VITALS — BP 120/70 | HR 92 | Ht 72.0 in | Wt 195.4 lb

## 2023-06-22 DIAGNOSIS — R634 Abnormal weight loss: Secondary | ICD-10-CM

## 2023-06-22 NOTE — Progress Notes (Signed)
Date:  06/22/2023   Name:  Angel Cummings.   DOB:  1945-07-12   MRN:  [id number]   Chief Complaint: Weight Loss (Patient following up on his weight loss. Patient has lost 2 more pounds since his last visit. )  Patient is a 78 year old male who presents for a comprehensive physical exam. The patient reports the following problems: weight loss (eating better). Health maintenance has been reviewed up to date.    Other This is a new (weight loss) problem. The current episode started more than 1 month ago. The problem has been gradually worsening. Associated symptoms include urinary symptoms. Pertinent negatives include no abdominal pain, anorexia, arthralgias, change in bowel habit, chest pain, chills, congestion, coughing, fatigue, fever, headaches, joint swelling, myalgias, nausea, neck pain, numbness, rash, sore throat, swollen glands, vertigo, visual change or vomiting. Associated symptoms comments: Frequency.    Lab Results  Component Value Date   NA 147 (H) 04/19/2023   K 4.3 04/19/2023   CO2 24 04/19/2023   GLUCOSE 100 (H) 04/19/2023   BUN 11 04/19/2023   CREATININE 1.20 04/19/2023   CALCIUM 9.7 04/19/2023   EGFR 63 04/19/2023   GFRNONAA 72 04/19/2020   Lab Results  Component Value Date   CHOL 185 12/12/2022   HDL 38 (L) 12/12/2022   LDLCALC 121 (H) 12/12/2022   TRIG 145 12/12/2022   CHOLHDL 5.0 06/25/2017   Lab Results  Component Value Date   TSH 1.060 05/04/2023   No results found for: "HGBA1C" Lab Results  Component Value Date   WBC 4.8 04/19/2023   HGB 15.0 04/19/2023   HCT 45.4 04/19/2023   MCV 93 04/19/2023   PLT 218 04/19/2023   Lab Results  Component Value Date   ALT 15 05/04/2023   AST 14 05/04/2023   ALKPHOS 83 05/04/2023   BILITOT 0.6 05/04/2023   No results found for: "25OHVITD2", "25OHVITD3", "VD25OH"   Review of Systems  Constitutional:  Negative for chills, fatigue and fever.  HENT:  Negative for congestion, drooling, ear discharge, ear  pain and sore throat.   Respiratory:  Negative for cough, shortness of breath and wheezing.   Cardiovascular:  Negative for chest pain, palpitations and leg swelling.  Gastrointestinal:  Negative for abdominal pain, anorexia, blood in stool, change in bowel habit, constipation, diarrhea, nausea and vomiting.  Endocrine: Negative for polydipsia.  Genitourinary:  Negative for dysuria, frequency, hematuria and urgency.  Musculoskeletal:  Negative for arthralgias, back pain, joint swelling, myalgias and neck pain.  Skin:  Negative for rash.  Allergic/Immunologic: Negative for environmental allergies.  Neurological:  Negative for dizziness, vertigo, numbness and headaches.  Hematological:  Does not bruise/bleed easily.  Psychiatric/Behavioral:  Negative for suicidal ideas. The patient is not nervous/anxious.     Patient Active Problem List   Diagnosis Date Noted   Lymphedema 12/18/2021   Leg weakness 12/18/2021   Screen for colon cancer    Cubital tunnel syndrome on right 07/18/2017   Radiculopathy of cervical region 07/18/2017   Acute postoperative pain 12/21/2016   Open fracture of shaft of right ulna 12/20/2016   Arthritis of both hands 04/17/2016   Arthritis of hand 04/17/2016   Essential hypertension 06/01/2015   Hyperlipidemia 06/01/2015    Allergies  Allergen Reactions   Amiodarone Other (See Comments)    Patient felt like he was standing on ice   Amoxicillin Rash   Etodolac Rash and Hives    Past Surgical History:  Procedure Laterality Date  COLONOSCOPY  2005   Dr Ether Griffins in [facility]   COLONOSCOPY WITH PROPOFOL N/A 12/18/2019   Procedure: COLONOSCOPY WITH PROPOFOL;  Surgeon: Midge Minium, MD;  Location: [facility];  Service: Endoscopy;  Laterality: N/A;   HERNIA REPAIR     KNEE SURGERY Left     Social History   Tobacco Use   Smoking status: Never   Smokeless tobacco: Never  Vaping Use   Vaping status: Never Used  Substance Use Topics   Alcohol use: Yes     Alcohol/week: 2.0 standard drinks of alcohol    Types: 2 Cans of beer per week    Comment: 2-3 times per week   Drug use: No     Medication list has been reviewed and updated.  Current Meds  Medication Sig   amiodarone (PACERONE) 200 MG tablet Take 200 mg by mouth daily.   amLODipine (NORVASC) 10 MG tablet Take 1 tablet (10 mg total) by mouth daily.   apixaban (ELIQUIS) 5 MG TABS tablet Take 1 tablet by mouth 2 (two) times daily. cardio   azelastine (ASTELIN) 0.1 % nasal spray Place 1 spray into both nostrils 2 (two) times daily. Use in each nostril as directed   cetirizine (ZYRTEC) 10 MG tablet Take 10 mg by mouth at bedtime.   diphenhydrAMINE (BENADRYL) 25 MG tablet Take 25 mg by mouth every 6 (six) hours as needed.   JARDIANCE 10 MG TABS tablet Take 10 mg by mouth daily.   losartan (COZAAR) 100 MG tablet Take 1 tablet (100 mg total) by mouth daily.   montelukast (SINGULAIR) 10 MG tablet Take 1 tablet (10 mg total) by mouth at bedtime.   spironolactone (ALDACTONE) 25 MG tablet Take by mouth.   triamcinolone (NASACORT) 55 MCG/ACT AERO nasal inhaler Place 2 sprays into the nose daily.   VYNDAMAX 61 MG CAPS Take 1 capsule by mouth daily.       06/22/2023    9:06 AM 05/04/2023    9:35 AM 06/06/2022    1:46 PM 03/28/2022    1:51 PM  GAD 7 : Generalized Anxiety Score  Nervous, Anxious, on Edge 0 0 0 0  Control/stop worrying 0 0 0 0  Worry too much - different things 0 0 0 0  Trouble relaxing 0 0 0 0  Restless 0 0 0 0  Easily annoyed or irritable 0 0 0 0  Afraid - awful might happen 0 0 0 0  Total GAD 7 Score 0 0 0 0  Anxiety Difficulty Not difficult at all Not difficult at all Not difficult at all Not difficult at all       06/22/2023    9:06 AM 06/22/2023    9:05 AM 05/04/2023    9:35 AM  Depression screen PHQ 2/9  Decreased Interest  0 0  Down, Depressed, Hopeless 0 0 0  PHQ - 2 Score 0 0 0  Altered sleeping 0 0 3  Tired, decreased energy 0 0 3  Change in appetite  0 0 0  Feeling bad or failure about yourself  0 0 0  Trouble concentrating 0 0 0  Moving slowly or fidgety/restless 0 0 0  Suicidal thoughts 0 0 0  PHQ-9 Score 0 0 6  Difficult doing work/chores Not difficult at all  Not difficult at all    BP Readings from Last 3 Encounters:  06/22/23 120/70  05/04/23 130/80  12/12/22 120/78    Physical Exam Vitals and nursing note reviewed.  Constitutional:  Appearance: He is well-developed.  HENT:     Head: Normocephalic and atraumatic.     Right Ear: Tympanic membrane, ear canal and external ear normal.     Left Ear: Tympanic membrane, ear canal and external ear normal.     Nose: Nose normal.     Mouth/Throat:     Mouth: Mucous membranes are moist.     Dentition: Normal dentition.     Pharynx: No oropharyngeal exudate or posterior oropharyngeal erythema.  Eyes:     General: Lids are normal. No scleral icterus.    Conjunctiva/sclera: Conjunctivae normal.     Pupils: Pupils are equal, round, and reactive to light.  Neck:     Thyroid: No thyromegaly.     Vascular: No carotid bruit, hepatojugular reflux or JVD.     Trachea: No tracheal deviation.  Cardiovascular:     Rate and Rhythm: Normal rate and regular rhythm.     Heart sounds: Normal heart sounds. No murmur heard.    No gallop.  Pulmonary:     Effort: Pulmonary effort is normal.     Breath sounds: Normal breath sounds. No wheezing, rhonchi or rales.  Abdominal:     General: Bowel sounds are normal.     Palpations: Abdomen is soft. There is no hepatomegaly, splenomegaly or mass.     Tenderness: There is no abdominal tenderness.     Hernia: There is no hernia in the left inguinal area.  Genitourinary:    Prostate: Normal. Not enlarged, not tender and no nodules present.     Rectum: Normal. Guaiac result negative. No mass.  Musculoskeletal:        General: Normal range of motion.     Cervical back: Normal range of motion and neck supple.  Lymphadenopathy:     Cervical: No  cervical adenopathy.  Skin:    General: Skin is warm and dry.     Findings: No rash.  Neurological:     Mental Status: He is alert and oriented to person, place, and time.     Sensory: No sensory deficit.     Deep Tendon Reflexes: Reflexes are normal and symmetric.  Psychiatric:        Mood and Affect: Mood is not anxious or depressed.     Wt Readings from Last 3 Encounters:  06/22/23 195 lb 6.4 oz (88.6 kg)  05/04/23 197 lb 6.4 oz (89.5 kg)  12/12/22 203 lb (92.1 kg)    BP 120/70   Pulse 92   Ht 6' (1.829 m)   Wt 195 lb 6.4 oz (88.6 kg)   SpO2 96%   BMI 26.50 kg/m   Assessment and Plan:  1. Weight loss, non-intentional (Primary) New onset.  Over the course of the last several months he has been gradual weight loss.  Patient is eating better because his wife is cooking more healthy meals and as he said "my cook quit ".  So rather than his usual meals he has been eating healthy and attributes this to his weight loss.  Today we did a DRE which was normal and we checked his blood work results from the previous and review was normal.  And we are going to try and get a urinalysis but patient has recently urinated and may not be able to urinate on demand today.  Will have patient return in 3 to 4 months for recheck.  In the meantime patient has been given information for transfer. - POCT urine qual dipstick protein  Elizabeth Sauer, MD

## 2023-06-27 ENCOUNTER — Other Ambulatory Visit: Payer: Self-pay | Admitting: Family Medicine

## 2023-06-27 DIAGNOSIS — I10 Essential (primary) hypertension: Secondary | ICD-10-CM

## 2023-07-13 DIAGNOSIS — Z7901 Long term (current) use of anticoagulants: Secondary | ICD-10-CM | POA: Diagnosis not present

## 2023-07-13 DIAGNOSIS — E854 Organ-limited amyloidosis: Secondary | ICD-10-CM | POA: Diagnosis not present

## 2023-07-13 DIAGNOSIS — Z9889 Other specified postprocedural states: Secondary | ICD-10-CM | POA: Diagnosis not present

## 2023-07-13 DIAGNOSIS — I5032 Chronic diastolic (congestive) heart failure: Secondary | ICD-10-CM | POA: Diagnosis not present

## 2023-07-13 DIAGNOSIS — I480 Paroxysmal atrial fibrillation: Secondary | ICD-10-CM | POA: Diagnosis not present

## 2023-07-13 DIAGNOSIS — I43 Cardiomyopathy in diseases classified elsewhere: Secondary | ICD-10-CM | POA: Diagnosis not present

## 2023-07-13 DIAGNOSIS — Z8679 Personal history of other diseases of the circulatory system: Secondary | ICD-10-CM | POA: Diagnosis not present

## 2023-09-05 ENCOUNTER — Ambulatory Visit: Payer: Self-pay

## 2023-09-05 NOTE — Telephone Encounter (Signed)
 Noted   Pt has a appointment.  KP

## 2023-09-05 NOTE — Telephone Encounter (Signed)
  Chief Complaint: 30 min period of Fatigue 09/04/2023 Symptoms: fatigue-asymptomatic currently on 09/05/2023 Frequency: occurred on 09/04/2023 Pertinent Negatives: Patient denies lightheadedness, dizziness, numbness/tingling, CP, SOB Disposition: [] ED /[] Urgent Care (no appt availability in office) / [x] Appointment(In office/virtual)/ []  [facility]/ [] Home Care/ [] Refused Recommended Disposition /[] [facility]/ []  Follow-up with PCP Additional Notes: patient called to report 30 minute period of extreme fatigue last night after getting out of the shower. Patient states he was fine before the shower. Patient reports after his shower he was very tired and could hardly dry himself off. Patient denies lightheadedness, dizziness, numbness/tingling, CP or SOB. Patient states that after 30 minutes he felt fine and was able to walk to the living room. Patient denies any symptoms currently. Patient states no new medications in the last couple of months. Per protocol, patient is recommended to be seen in 24 hours. Appointment scheduled with PCP for 9:00 AM for Sep 06, 2023. Patient verbalized understanding of plan and all questions answered.    Copied from CRM [id number]. Topic: Clinical - Red Word Triage >> Sep 05, 2023 10:05 AM Juluis Ok wrote: [facility] that prompted transfer to Nurse Triage: lightheaded and fatigue Reason for Disposition  Taking a medicine that could cause weakness (e.g., blood pressure medications, diuretics)  Answer Assessment - Initial Assessment Questions 1. DESCRIPTION: "Describe how you are feeling."     Patient reports that he had a period of 30 minutes of extreme fatigue last night. Patient states that the symptoms went away afterward 2. SEVERITY: "How bad is it?"  "Can you stand and walk?"   - MILD (0-3): Feels weak or tired, but does not interfere with work, school or normal activities.   - MODERATE (4-7): Able to stand and walk; weakness interferes with work,  school, or normal activities.   - SEVERE (8-10): Unable to stand or walk; unable to do usual activities.     Mild 3. ONSET: "When did these symptoms begin?" (e.g., hours, days, weeks, months)     Started last night and last for 30 minutes 4. CAUSE: "What do you think is causing the weakness or fatigue?" (e.g., not drinking enough fluids, medical problem, trouble sleeping)     unsure 5. NEW MEDICINES:  "Have you started on any new medicines recently?" (e.g., opioid pain medicines, benzodiazepines, muscle relaxants, antidepressants, antihistamines, neuroleptics, beta blockers)     No new medications in the last couple of months 6. OTHER SYMPTOMS: "Do you have any other symptoms?" (e.g., chest pain, fever, cough, SOB, vomiting, diarrhea, bleeding, other areas of pain)     no  Protocols used: Weakness (Generalized) and Fatigue-A-AH

## 2023-09-06 ENCOUNTER — Encounter: Payer: Self-pay | Admitting: Family Medicine

## 2023-09-06 ENCOUNTER — Ambulatory Visit (INDEPENDENT_AMBULATORY_CARE_PROVIDER_SITE_OTHER): Admitting: Family Medicine

## 2023-09-06 VITALS — BP 136/76 | HR 65 | Ht 72.0 in | Wt 193.0 lb

## 2023-09-06 DIAGNOSIS — R55 Syncope and collapse: Secondary | ICD-10-CM

## 2023-09-06 DIAGNOSIS — I10 Essential (primary) hypertension: Secondary | ICD-10-CM

## 2023-09-06 NOTE — Patient Instructions (Signed)
Syncope, Adult  Syncope refers to a condition in which a person temporarily loses consciousness. Syncope may also be called fainting or passing out. It is caused by a sudden decrease in blood flow to the brain. This can happen for a variety of reasons. Most causes of syncope are not dangerous. It can be triggered by things such as needle sticks, seeing blood, pain, or intense emotion. However, syncope can also be a sign of a serious medical problem, such as a heart abnormality. Other causes can include dehydration, migraines, or taking medicines that lower blood pressure. Your health care provider may do tests to find the reason why you are having syncope. If you faint, get medical help right away. Call your local emergency services (911 in the [place] Follow these instructions at home: Pay attention to any changes in your symptoms. Take these actions to stay safe and to help relieve your symptoms: Knowing when you may be about to faint Signs that you may be about to faint include: Feeling dizzy, weak, light-headed, or like the room is spinning. Feeling nauseous. Seeing spots or seeing all white or all black in your field of vision. Having cold, clammy skin or feeling warm and sweaty. Hearing ringing in the ears (tinnitus). If you start to feel like you might faint, sit or lie down right away. If sitting, put your head down between your legs. If lying down, raise (elevate) your feet above the level of your heart. Breathe deeply and steadily. Wait until all the symptoms have passed. Have someone stay with you until you feel stable. Medicines Take over-the-counter and prescription medicines only as told by your health care provider. If you are taking blood pressure or heart medicine, get up slowly and take several minutes to sit and then stand. This can reduce dizziness and decrease the risk of syncope. Lifestyle Do not drive, use machinery, or play sports until your health care provider says it is  okay. Do not drink alcohol. Do not use any products that contain nicotine or tobacco. These products include cigarettes, chewing tobacco, and vaping devices, such as e-cigarettes. If you need help quitting, ask your health care provider. Avoid hot tubs and saunas. General instructions Talk with your health care provider about your symptoms. You may need to have testing to understand the cause of your syncope. Drink enough fluid to keep your urine pale yellow. Avoid prolonged standing. If you must stand for a long time, do movements such as: Moving your legs. Crossing your legs. Flexing and stretching your leg muscles. Squatting. Keep all follow-up visits. This is important. Contact a health care provider if: You have episodes of near fainting. Get help right away if: You faint. You hit your head or are injured after fainting. You have any of these symptoms that may indicate trouble with your heart: Fast or irregular heartbeats (palpitations). Unusual pain in your chest, abdomen, or back. Shortness of breath. You have a seizure. You have a severe headache. You are confused. You have vision problems. You have severe weakness or trouble walking. You are bleeding from your mouth or rectum, or you have black or tarry stool. These symptoms may represent a serious problem that is an emergency. Do not wait to see if your symptoms will go away. Get medical help right away. Call your local emergency services (911 in the [place] Do not drive yourself to the hospital. Summary Syncope refers to a condition in which a person temporarily loses consciousness. Syncope may also be called   fainting or passing out. It is caused by a sudden decrease in blood flow to the brain. Signs that you may be about to faint include dizziness, feeling light-headed, feeling nauseous, sudden vision changes, or cold, clammy skin. Even though most causes of syncope are not dangerous, syncope can be a sign of a serious  medical problem. Get help right away if you faint. If you start to feel like you might faint, sit or lie down right away. If sitting, put your head down between your legs. If lying down, raise (elevate) your feet above the level of your heart. This information is not intended to replace advice given to you by your health care provider. Make sure you discuss any questions you have with your health care provider. Document Revised: 09/02/2020 Document Reviewed: 09/02/2020 Elsevier Patient Education  2024 [employer]vier Inc.  

## 2023-09-06 NOTE — Progress Notes (Signed)
 Date:  09/06/2023   Name:  Angel Cummings.   DOB:  1945/05/15   MRN:  [id number]   Chief Complaint: Fatigue and Extremity Weakness (X2 days ago, Sat down for 30 mins before he could up, got out of the shower and sat down tried to get up and could hold the towel of get up, hasn't happened again since then, has never happened before )  Loss of Consciousness This is a new problem. The problem occurs rarely. The problem has been gradually improving. He lost consciousness for a period of less than 1 minute. The symptoms are aggravated by standing (shower/). Pertinent negatives include no abdominal pain, back pain, bladder incontinence, bowel incontinence, chest pain, dizziness, fever, headaches, nausea, palpitations or slurred speech. Associated symptoms comments: pale. He has tried position change for the symptoms. The treatment provided moderate relief. His past medical history is significant for CAD and HTN. There is no history of arrhythmia or seizures.    Lab Results  Component Value Date   NA 147 (H) 04/19/2023   K 4.3 04/19/2023   CO2 24 04/19/2023   GLUCOSE 100 (H) 04/19/2023   BUN 11 04/19/2023   CREATININE 1.20 04/19/2023   CALCIUM  9.7 04/19/2023   EGFR 63 04/19/2023   GFRNONAA 72 04/19/2020   Lab Results  Component Value Date   CHOL 185 12/12/2022   HDL 38 (L) 12/12/2022   LDLCALC 121 (H) 12/12/2022   TRIG 145 12/12/2022   CHOLHDL 5.0 06/25/2017   Lab Results  Component Value Date   TSH 1.060 05/04/2023   No results found for: "HGBA1C" Lab Results  Component Value Date   WBC 4.8 04/19/2023   HGB 15.0 04/19/2023   HCT 45.4 04/19/2023   MCV 93 04/19/2023   PLT 218 04/19/2023   Lab Results  Component Value Date   ALT 15 05/04/2023   AST 14 05/04/2023   ALKPHOS 83 05/04/2023   BILITOT 0.6 05/04/2023   No results found for: "25OHVITD2", "25OHVITD3", "VD25OH"   Review of Systems  Constitutional:  Negative for chills and fever.  HENT:  Negative for drooling,  ear discharge, ear pain and sore throat.   Respiratory:  Negative for cough, shortness of breath and wheezing.   Cardiovascular:  Positive for syncope. Negative for chest pain, palpitations and leg swelling.  Gastrointestinal:  Negative for abdominal pain, blood in stool, bowel incontinence, constipation, diarrhea and nausea.  Endocrine: Negative for polydipsia.  Genitourinary:  Negative for bladder incontinence, dysuria, frequency, hematuria and urgency.  Musculoskeletal:  Negative for back pain, myalgias and neck pain.  Skin:  Negative for rash.  Allergic/Immunologic: Negative for environmental allergies.  Neurological:  Negative for dizziness and headaches.  Hematological:  Does not bruise/bleed easily.  Psychiatric/Behavioral:  Negative for suicidal ideas. The patient is not nervous/anxious.     Patient Active Problem List   Diagnosis Date Noted   Lymphedema 12/18/2021   Leg weakness 12/18/2021   Screen for colon cancer    Cubital tunnel syndrome on right 07/18/2017   Radiculopathy of cervical region 07/18/2017   Acute postoperative pain 12/21/2016   Open fracture of shaft of right ulna 12/20/2016   Arthritis of both hands 04/17/2016   Arthritis of hand 04/17/2016   Essential hypertension 06/01/2015   Hyperlipidemia 06/01/2015    Allergies  Allergen Reactions   Amiodarone Other (See Comments)    Patient felt like he was standing on ice   Amoxicillin  Rash   Etodolac Rash and Hives  Past Surgical History:  Procedure Laterality Date   COLONOSCOPY  2005   Dr Althea Atkinson in [facility]   COLONOSCOPY WITH PROPOFOL  N/A 12/18/2019   Procedure: COLONOSCOPY WITH PROPOFOL ;  Surgeon: Marnee Sink, MD;  Location: [facility];  Service: Endoscopy;  Laterality: N/A;   HERNIA REPAIR     KNEE SURGERY Left     Social History   Tobacco Use   Smoking status: Never   Smokeless tobacco: Never  Vaping Use   Vaping status: Never Used  Substance Use Topics   Alcohol use: Yes     Alcohol/week: 2.0 standard drinks of alcohol    Types: 2 Cans of beer per week    Comment: 2-3 times per week   Drug use: No     Medication list has been reviewed and updated.  Current Meds  Medication Sig   acetaminophen  (TYLENOL ) 650 MG CR tablet Take 1,300 mg by mouth every 8 (eight) hours as needed for pain.   amiodarone (PACERONE) 200 MG tablet Take 200 mg by mouth daily.   amLODipine  (NORVASC ) 10 MG tablet TAKE 1 TABLET BY MOUTH DAILY   apixaban (ELIQUIS) 5 MG TABS tablet Take 1 tablet by mouth 2 (two) times daily. cardio   aspirin  81 MG tablet Take 1 tablet (81 mg total) by mouth daily.   atorvastatin  (LIPITOR) 10 MG tablet Take 10 mg by mouth daily.   azelastine  (ASTELIN ) 0.1 % nasal spray Place 1 spray into both nostrils 2 (two) times daily. Use in each nostril as directed   Ca Carbonate-Mag Hydroxide (ROLAIDS PO) Take by mouth as needed.   cetirizine (ZYRTEC) 10 MG tablet Take 10 mg by mouth at bedtime.   diphenhydrAMINE (BENADRYL) 25 MG tablet Take 25 mg by mouth every 6 (six) hours as needed.   JARDIANCE 10 MG TABS tablet Take 10 mg by mouth daily.   losartan  (COZAAR ) 100 MG tablet Take 1 tablet (100 mg total) by mouth daily.   montelukast  (SINGULAIR ) 10 MG tablet Take 1 tablet (10 mg total) by mouth at bedtime.   spironolactone (ALDACTONE) 25 MG tablet Take by mouth.   triamcinolone  (NASACORT ) 55 MCG/ACT AERO nasal inhaler Place 2 sprays into the nose daily.   VYNDAMAX 61 MG CAPS Take 1 capsule by mouth daily.       09/06/2023    8:57 AM 06/22/2023    9:06 AM 05/04/2023    9:35 AM 06/06/2022    1:46 PM  GAD 7 : Generalized Anxiety Score  Nervous, Anxious, on Edge 0 0 0 0  Control/stop worrying 0 0 0 0  Worry too much - different things 0 0 0 0  Trouble relaxing 0 0 0 0  Restless 0 0 0 0  Easily annoyed or irritable 0 0 0 0  Afraid - awful might happen 0 0 0 0  Total GAD 7 Score 0 0 0 0  Anxiety Difficulty Not difficult at all Not difficult at all Not difficult at  all Not difficult at all       09/06/2023    8:57 AM 06/22/2023    9:06 AM 06/22/2023    9:05 AM  Depression screen PHQ 2/9  Decreased Interest 0  0  Down, Depressed, Hopeless 0 0 0  PHQ - 2 Score 0 0 0  Altered sleeping  0 0  Tired, decreased energy  0 0  Change in appetite  0 0  Feeling bad or failure about yourself   0 0  Trouble concentrating  0 0  Moving slowly or fidgety/restless  0 0  Suicidal thoughts  0 0  PHQ-9 Score  0 0  Difficult doing work/chores  Not difficult at all     BP Readings from Last 3 Encounters:  09/06/23 136/76  06/22/23 120/70  05/04/23 130/80    Physical Exam Vitals and nursing note reviewed.  Constitutional:      Appearance: He is well-developed.  HENT:     Head: Normocephalic and atraumatic.     Right Ear: Tympanic membrane, ear canal and external ear normal.     Left Ear: Tympanic membrane, ear canal and external ear normal.     Nose: Nose normal.     Mouth/Throat:     Dentition: Normal dentition.  Eyes:     General: Lids are normal. No scleral icterus.    Conjunctiva/sclera: Conjunctivae normal.     Pupils: Pupils are equal, round, and reactive to light.  Neck:     Thyroid : No thyromegaly.     Vascular: No carotid bruit, hepatojugular reflux or JVD.     Trachea: No tracheal deviation.  Cardiovascular:     Rate and Rhythm: Regular rhythm. Bradycardia present.     Heart sounds: Normal heart sounds.     No gallop.  Pulmonary:     Effort: Pulmonary effort is normal.     Breath sounds: Normal breath sounds. No wheezing, rhonchi or rales.  Abdominal:     General: Bowel sounds are normal.     Palpations: Abdomen is soft. There is no hepatomegaly, splenomegaly or mass.     Tenderness: There is no abdominal tenderness.     Hernia: There is no hernia in the left inguinal area.  Musculoskeletal:        General: Normal range of motion.     Cervical back: Normal range of motion and neck supple.  Lymphadenopathy:     Cervical: No cervical  adenopathy.  Skin:    General: Skin is warm and dry.     Findings: No rash.  Neurological:     Mental Status: He is alert and oriented to person, place, and time.     Sensory: No sensory deficit.     Deep Tendon Reflexes: Reflexes are normal and symmetric.  Psychiatric:        Mood and Affect: Mood is not anxious or depressed.     Wt Readings from Last 3 Encounters:  06/22/23 195 lb 6.4 oz (88.6 kg)  05/04/23 197 lb 6.4 oz (89.5 kg)  12/12/22 203 lb (92.1 kg)    BP 136/76   Pulse 65   Ht 6' (1.829 m)   SpO2 98%   BMI 26.50 kg/m   Assessment and Plan: History obtained from patient and also calling his spouse to describe exactly what she witnessed. 1. Vasovagal near syncope (Primary) New onset.  1 episode.  Occurred with extenuating circumstances which may have had a cause-and-effect.  Currently stable without dizziness without feelings that were similar to prior to syncopal episode.  Patient has a history of hypertension and is currently on a regimen of losartan  100 mg and amlodipine  10 mg.  Patient is also on spironolactone as well as amiodarone per cardiology.  Patient has been asymptomatic as far as similar symptomatology.  I suspect this was a confluence of several things including patient is on multiple medications that can affect blood pressure, patient perhaps was a bit dehydrated at the time, patient took a hot shower and it was right after getting  taken the hot shower that he began having issues that he felt like he was going to pass out.  This was witnessed by his spouse and patient was noted to be pale but denied diaphoresis or nausea.  Patient recovered within 45 seconds and has not felt similar concerns.  Patient did voice concerns about spironolactone I told him given he has upcoming appointment with cardiology that this may need to be explored along with his amiodarone and whether or not we continue at current dosings of amlodipine  and losartan  - Renal Function Panel  2.  Essential hypertension Chronic.  Controlled.  Stable.  Blood pressure is usually in the high 30s of systolic and high 70s diastolic..  I feel that this is a good blood pressure reading for patient to continue and that it is not too high or low and should be sustainable for control.  I suspect that it was multiple factorial of why the patient had a near syncope episode or quite frankly syncope and we will stressed that patient needs to take fluids during the day and maintain on current antihypertension regimen.  We will recheck patient in 3 to 4 weeks as far as blood pressure reading and determine whether or not we need to make an adjustment. - Renal Function Panel    Alayne Allis, MD

## 2023-09-07 LAB — RENAL FUNCTION PANEL
Albumin: 4.3 g/dL (ref 3.8–4.8)
BUN/Creatinine Ratio: 11 (ref 10–24)
BUN: 12 mg/dL (ref 8–27)
CO2: 21 mmol/L (ref 20–29)
Calcium: 9 mg/dL (ref 8.6–10.2)
Chloride: 107 mmol/L — ABNORMAL HIGH (ref 96–106)
Creatinine, Ser: 1.08 mg/dL (ref 0.76–1.27)
Glucose: 98 mg/dL (ref 70–99)
Phosphorus: 3.7 mg/dL (ref 2.8–4.1)
Potassium: 4 mmol/L (ref 3.5–5.2)
Sodium: 143 mmol/L (ref 134–144)
eGFR: 72 mL/min/{1.73_m2} (ref >59)

## 2023-09-12 DIAGNOSIS — E854 Organ-limited amyloidosis: Secondary | ICD-10-CM | POA: Diagnosis not present

## 2023-09-12 DIAGNOSIS — I471 Supraventricular tachycardia, unspecified: Secondary | ICD-10-CM | POA: Diagnosis not present

## 2023-09-12 DIAGNOSIS — I43 Cardiomyopathy in diseases classified elsewhere: Secondary | ICD-10-CM | POA: Diagnosis not present

## 2023-09-12 DIAGNOSIS — I495 Sick sinus syndrome: Secondary | ICD-10-CM | POA: Diagnosis not present

## 2023-09-12 DIAGNOSIS — I5032 Chronic diastolic (congestive) heart failure: Secondary | ICD-10-CM | POA: Diagnosis not present

## 2023-09-12 DIAGNOSIS — I480 Paroxysmal atrial fibrillation: Secondary | ICD-10-CM | POA: Diagnosis not present

## 2023-09-24 ENCOUNTER — Other Ambulatory Visit: Payer: Self-pay | Admitting: Family Medicine

## 2023-09-24 DIAGNOSIS — I10 Essential (primary) hypertension: Secondary | ICD-10-CM

## 2023-09-25 DIAGNOSIS — I5032 Chronic diastolic (congestive) heart failure: Secondary | ICD-10-CM | POA: Diagnosis not present

## 2023-09-25 DIAGNOSIS — I43 Cardiomyopathy in diseases classified elsewhere: Secondary | ICD-10-CM | POA: Diagnosis not present

## 2023-09-25 DIAGNOSIS — I480 Paroxysmal atrial fibrillation: Secondary | ICD-10-CM | POA: Diagnosis not present

## 2023-09-25 DIAGNOSIS — I495 Sick sinus syndrome: Secondary | ICD-10-CM | POA: Diagnosis not present

## 2023-09-25 DIAGNOSIS — E854 Organ-limited amyloidosis: Secondary | ICD-10-CM | POA: Diagnosis not present

## 2023-09-25 DIAGNOSIS — I471 Supraventricular tachycardia, unspecified: Secondary | ICD-10-CM | POA: Diagnosis not present

## 2023-09-26 NOTE — Telephone Encounter (Signed)
 Requested Prescriptions  Pending Prescriptions Disp Refills   losartan  (COZAAR ) 100 MG tablet [Pharmacy Med Name: LOSARTAN  POTASSIUM 100 MG TAB] 90 tablet 1    Sig: TAKE 1 TABLET BY MOUTH DAILY     Cardiovascular:  Angiotensin Receptor Blockers Passed - 09/26/2023 10:05 AM      Passed - Cr in normal range and within 180 days    Creatinine, Ser  Date Value Ref Range Status  09/06/2023 1.08 0.76 - 1.27 mg/dL Final         Passed - K in normal range and within 180 days    Potassium  Date Value Ref Range Status  09/06/2023 4.0 3.5 - 5.2 mmol/L Final         Passed - Patient is not pregnant      Passed - Last BP in normal range    BP Readings from Last 1 Encounters:  09/06/23 136/76         Passed - Valid encounter within last 6 months    Recent Outpatient Visits           2 weeks ago Vasovagal near syncope   [facility] Kayla Part, MD   3 months ago Weight loss, non-intentional   [facility] Kayla Part, MD

## 2023-10-05 DIAGNOSIS — I4719 Other supraventricular tachycardia: Secondary | ICD-10-CM | POA: Diagnosis not present

## 2023-10-05 DIAGNOSIS — E8582 Wild-type transthyretin-related (ATTR) amyloidosis: Secondary | ICD-10-CM | POA: Diagnosis not present

## 2023-10-05 DIAGNOSIS — E785 Hyperlipidemia, unspecified: Secondary | ICD-10-CM | POA: Diagnosis not present

## 2023-10-05 DIAGNOSIS — Z880 Allergy status to penicillin: Secondary | ICD-10-CM | POA: Diagnosis not present

## 2023-10-05 DIAGNOSIS — R55 Syncope and collapse: Secondary | ICD-10-CM | POA: Diagnosis not present

## 2023-10-05 DIAGNOSIS — I495 Sick sinus syndrome: Secondary | ICD-10-CM | POA: Diagnosis not present

## 2023-10-05 DIAGNOSIS — I5022 Chronic systolic (congestive) heart failure: Secondary | ICD-10-CM | POA: Diagnosis not present

## 2023-10-05 DIAGNOSIS — Z85828 Personal history of other malignant neoplasm of skin: Secondary | ICD-10-CM | POA: Diagnosis not present

## 2023-10-05 DIAGNOSIS — I472 Ventricular tachycardia, unspecified: Secondary | ICD-10-CM | POA: Diagnosis not present

## 2023-10-05 DIAGNOSIS — I4729 Other ventricular tachycardia: Secondary | ICD-10-CM | POA: Diagnosis not present

## 2023-10-05 DIAGNOSIS — R918 Other nonspecific abnormal finding of lung field: Secondary | ICD-10-CM | POA: Diagnosis not present

## 2023-10-05 DIAGNOSIS — I5032 Chronic diastolic (congestive) heart failure: Secondary | ICD-10-CM | POA: Diagnosis not present

## 2023-10-05 DIAGNOSIS — I110 Hypertensive heart disease with heart failure: Secondary | ICD-10-CM | POA: Diagnosis not present

## 2023-10-05 DIAGNOSIS — Z7901 Long term (current) use of anticoagulants: Secondary | ICD-10-CM | POA: Diagnosis not present

## 2023-10-05 DIAGNOSIS — I447 Left bundle-branch block, unspecified: Secondary | ICD-10-CM | POA: Diagnosis not present

## 2023-10-05 DIAGNOSIS — Z7984 Long term (current) use of oral hypoglycemic drugs: Secondary | ICD-10-CM | POA: Diagnosis not present

## 2023-10-05 DIAGNOSIS — Z452 Encounter for adjustment and management of vascular access device: Secondary | ICD-10-CM | POA: Diagnosis not present

## 2023-10-05 DIAGNOSIS — Z888 Allergy status to other drugs, medicaments and biological substances status: Secondary | ICD-10-CM | POA: Diagnosis not present

## 2023-10-05 DIAGNOSIS — E119 Type 2 diabetes mellitus without complications: Secondary | ICD-10-CM | POA: Diagnosis not present

## 2023-10-05 DIAGNOSIS — I43 Cardiomyopathy in diseases classified elsewhere: Secondary | ICD-10-CM | POA: Diagnosis not present

## 2023-10-05 DIAGNOSIS — E854 Organ-limited amyloidosis: Secondary | ICD-10-CM | POA: Diagnosis not present

## 2023-10-05 DIAGNOSIS — I493 Ventricular premature depolarization: Secondary | ICD-10-CM | POA: Diagnosis not present

## 2023-10-05 DIAGNOSIS — J90 Pleural effusion, not elsewhere classified: Secondary | ICD-10-CM | POA: Diagnosis not present

## 2023-10-05 DIAGNOSIS — Z79899 Other long term (current) drug therapy: Secondary | ICD-10-CM | POA: Diagnosis not present

## 2023-10-05 DIAGNOSIS — I480 Paroxysmal atrial fibrillation: Secondary | ICD-10-CM | POA: Diagnosis not present

## 2023-10-10 ENCOUNTER — Telehealth: Payer: Self-pay | Admitting: *Deleted

## 2023-10-10 NOTE — Transitions of Care (Post Inpatient/ED Visit) (Signed)
   10/10/2023  Name: Angel Cummings. MRN: [id number] DOB: 13-Jun-1945  Today's TOC FU Call Status:  Attempted to reach the patient regarding the most recent Inpatient/ED visit. [name] declined [facility] outreach. Reports having everything that he needs and frustration regarding discharge process from [facility] last night.  Patient establishing care with PCP at [facility] on 10/29/23.  Follow Up Plan: No further outreach attempts will be made at this time.  Arna Better RN, BSN [place]  [facility] RN Care Manager [phone]

## 2023-10-23 DIAGNOSIS — Z9889 Other specified postprocedural states: Secondary | ICD-10-CM | POA: Diagnosis not present

## 2023-10-23 DIAGNOSIS — I10 Essential (primary) hypertension: Secondary | ICD-10-CM | POA: Diagnosis not present

## 2023-10-23 DIAGNOSIS — I493 Ventricular premature depolarization: Secondary | ICD-10-CM | POA: Diagnosis not present

## 2023-10-23 DIAGNOSIS — I4729 Other ventricular tachycardia: Secondary | ICD-10-CM | POA: Diagnosis not present

## 2023-10-23 DIAGNOSIS — E854 Organ-limited amyloidosis: Secondary | ICD-10-CM | POA: Diagnosis not present

## 2023-10-23 DIAGNOSIS — Z8679 Personal history of other diseases of the circulatory system: Secondary | ICD-10-CM | POA: Diagnosis not present

## 2023-10-23 DIAGNOSIS — Z7901 Long term (current) use of anticoagulants: Secondary | ICD-10-CM | POA: Diagnosis not present

## 2023-10-23 DIAGNOSIS — Z79899 Other long term (current) drug therapy: Secondary | ICD-10-CM | POA: Diagnosis not present

## 2023-10-23 DIAGNOSIS — I5032 Chronic diastolic (congestive) heart failure: Secondary | ICD-10-CM | POA: Diagnosis not present

## 2023-10-23 DIAGNOSIS — I495 Sick sinus syndrome: Secondary | ICD-10-CM | POA: Diagnosis not present

## 2023-10-23 DIAGNOSIS — E785 Hyperlipidemia, unspecified: Secondary | ICD-10-CM | POA: Diagnosis not present

## 2023-10-23 DIAGNOSIS — I43 Cardiomyopathy in diseases classified elsewhere: Secondary | ICD-10-CM | POA: Diagnosis not present

## 2023-10-23 DIAGNOSIS — Z5181 Encounter for therapeutic drug level monitoring: Secondary | ICD-10-CM | POA: Diagnosis not present

## 2023-10-23 DIAGNOSIS — I480 Paroxysmal atrial fibrillation: Secondary | ICD-10-CM | POA: Diagnosis not present

## 2023-10-23 DIAGNOSIS — I471 Supraventricular tachycardia, unspecified: Secondary | ICD-10-CM | POA: Diagnosis not present

## 2023-10-23 DIAGNOSIS — I4891 Unspecified atrial fibrillation: Secondary | ICD-10-CM | POA: Diagnosis not present

## 2023-10-23 DIAGNOSIS — I5041 Acute combined systolic (congestive) and diastolic (congestive) heart failure: Secondary | ICD-10-CM | POA: Diagnosis not present

## 2023-10-23 DIAGNOSIS — I4892 Unspecified atrial flutter: Secondary | ICD-10-CM | POA: Diagnosis not present

## 2023-10-29 DIAGNOSIS — E854 Organ-limited amyloidosis: Secondary | ICD-10-CM | POA: Diagnosis not present

## 2023-10-29 DIAGNOSIS — Z1331 Encounter for screening for depression: Secondary | ICD-10-CM | POA: Diagnosis not present

## 2023-10-29 DIAGNOSIS — Z7901 Long term (current) use of anticoagulants: Secondary | ICD-10-CM | POA: Diagnosis not present

## 2023-10-29 DIAGNOSIS — J309 Allergic rhinitis, unspecified: Secondary | ICD-10-CM | POA: Diagnosis not present

## 2023-10-29 DIAGNOSIS — I43 Cardiomyopathy in diseases classified elsewhere: Secondary | ICD-10-CM | POA: Diagnosis not present

## 2023-10-29 DIAGNOSIS — E782 Mixed hyperlipidemia: Secondary | ICD-10-CM | POA: Diagnosis not present

## 2023-10-29 DIAGNOSIS — I480 Paroxysmal atrial fibrillation: Secondary | ICD-10-CM | POA: Diagnosis not present

## 2024-01-02 DIAGNOSIS — I480 Paroxysmal atrial fibrillation: Secondary | ICD-10-CM | POA: Diagnosis not present

## 2024-01-02 DIAGNOSIS — I43 Cardiomyopathy in diseases classified elsewhere: Secondary | ICD-10-CM | POA: Diagnosis not present

## 2024-01-02 DIAGNOSIS — Z95810 Presence of automatic (implantable) cardiac defibrillator: Secondary | ICD-10-CM | POA: Diagnosis not present

## 2024-01-02 DIAGNOSIS — I10 Essential (primary) hypertension: Secondary | ICD-10-CM | POA: Diagnosis not present

## 2024-01-02 DIAGNOSIS — I4729 Other ventricular tachycardia: Secondary | ICD-10-CM | POA: Diagnosis not present

## 2024-01-02 DIAGNOSIS — E854 Organ-limited amyloidosis: Secondary | ICD-10-CM | POA: Diagnosis not present

## 2024-01-10 DIAGNOSIS — E785 Hyperlipidemia, unspecified: Secondary | ICD-10-CM | POA: Diagnosis not present

## 2024-01-10 DIAGNOSIS — I43 Cardiomyopathy in diseases classified elsewhere: Secondary | ICD-10-CM | POA: Diagnosis not present

## 2024-01-10 DIAGNOSIS — Z7901 Long term (current) use of anticoagulants: Secondary | ICD-10-CM | POA: Diagnosis not present

## 2024-01-10 DIAGNOSIS — I493 Ventricular premature depolarization: Secondary | ICD-10-CM | POA: Diagnosis not present

## 2024-01-10 DIAGNOSIS — I4891 Unspecified atrial fibrillation: Secondary | ICD-10-CM | POA: Diagnosis not present

## 2024-01-10 DIAGNOSIS — I4729 Other ventricular tachycardia: Secondary | ICD-10-CM | POA: Diagnosis not present

## 2024-01-10 DIAGNOSIS — I495 Sick sinus syndrome: Secondary | ICD-10-CM | POA: Diagnosis not present

## 2024-01-10 DIAGNOSIS — I5032 Chronic diastolic (congestive) heart failure: Secondary | ICD-10-CM | POA: Diagnosis not present

## 2024-01-10 DIAGNOSIS — I471 Supraventricular tachycardia, unspecified: Secondary | ICD-10-CM | POA: Diagnosis not present

## 2024-01-10 DIAGNOSIS — I480 Paroxysmal atrial fibrillation: Secondary | ICD-10-CM | POA: Diagnosis not present

## 2024-01-10 DIAGNOSIS — Z9889 Other specified postprocedural states: Secondary | ICD-10-CM | POA: Diagnosis not present

## 2024-01-10 DIAGNOSIS — I10 Essential (primary) hypertension: Secondary | ICD-10-CM | POA: Diagnosis not present

## 2024-02-05 DIAGNOSIS — Z95810 Presence of automatic (implantable) cardiac defibrillator: Secondary | ICD-10-CM | POA: Diagnosis not present

## 2024-02-05 DIAGNOSIS — I5032 Chronic diastolic (congestive) heart failure: Secondary | ICD-10-CM | POA: Diagnosis not present

## 2024-02-05 DIAGNOSIS — I43 Cardiomyopathy in diseases classified elsewhere: Secondary | ICD-10-CM | POA: Diagnosis not present

## 2024-02-05 DIAGNOSIS — I480 Paroxysmal atrial fibrillation: Secondary | ICD-10-CM | POA: Diagnosis not present

## 2024-02-05 DIAGNOSIS — E854 Organ-limited amyloidosis: Secondary | ICD-10-CM | POA: Diagnosis not present

## 2024-03-03 NOTE — Progress Notes (Addendum)
 Angel Cummings Angel Cummings.                                          MRN: [id number]   03/03/2024   The [facility] Quality Team Specialist reviewed this patient medical record for the purposes of chart review for care gap closure. The following were reviewed: chart review for care gap closure-kidney health evaluation for diabetes:eGFR  and uACR. Diabetes dx unconfirmed.  06/03/2024- could not close KED 2025   [facility]I Quality Team

## 2024-04-10 DIAGNOSIS — I471 Supraventricular tachycardia, unspecified: Secondary | ICD-10-CM | POA: Diagnosis not present

## 2024-04-10 DIAGNOSIS — I10 Essential (primary) hypertension: Secondary | ICD-10-CM | POA: Diagnosis not present

## 2024-04-10 DIAGNOSIS — E785 Hyperlipidemia, unspecified: Secondary | ICD-10-CM | POA: Diagnosis not present

## 2024-04-10 DIAGNOSIS — Z7901 Long term (current) use of anticoagulants: Secondary | ICD-10-CM | POA: Diagnosis not present

## 2024-04-10 DIAGNOSIS — I495 Sick sinus syndrome: Secondary | ICD-10-CM | POA: Diagnosis not present

## 2024-04-10 DIAGNOSIS — I4891 Unspecified atrial fibrillation: Secondary | ICD-10-CM | POA: Diagnosis not present

## 2024-04-10 DIAGNOSIS — I43 Cardiomyopathy in diseases classified elsewhere: Secondary | ICD-10-CM | POA: Diagnosis not present

## 2024-04-10 DIAGNOSIS — Z9889 Other specified postprocedural states: Secondary | ICD-10-CM | POA: Diagnosis not present

## 2024-04-10 DIAGNOSIS — I493 Ventricular premature depolarization: Secondary | ICD-10-CM | POA: Diagnosis not present

## 2024-04-10 DIAGNOSIS — I5032 Chronic diastolic (congestive) heart failure: Secondary | ICD-10-CM | POA: Diagnosis not present

## 2024-04-10 DIAGNOSIS — I4729 Other ventricular tachycardia: Secondary | ICD-10-CM | POA: Diagnosis not present

## 2024-04-10 DIAGNOSIS — Z95810 Presence of automatic (implantable) cardiac defibrillator: Secondary | ICD-10-CM | POA: Diagnosis not present

## 2024-04-17 DIAGNOSIS — I110 Hypertensive heart disease with heart failure: Secondary | ICD-10-CM | POA: Diagnosis not present

## 2024-04-17 DIAGNOSIS — R42 Dizziness and giddiness: Secondary | ICD-10-CM | POA: Diagnosis not present

## 2024-04-17 DIAGNOSIS — Z0000 Encounter for general adult medical examination without abnormal findings: Secondary | ICD-10-CM | POA: Diagnosis not present

## 2024-04-17 DIAGNOSIS — Z9889 Other specified postprocedural states: Secondary | ICD-10-CM | POA: Diagnosis not present

## 2024-04-17 DIAGNOSIS — Z1331 Encounter for screening for depression: Secondary | ICD-10-CM | POA: Diagnosis not present

## 2024-04-17 DIAGNOSIS — I5032 Chronic diastolic (congestive) heart failure: Secondary | ICD-10-CM | POA: Diagnosis not present

## 2024-04-17 DIAGNOSIS — Z8679 Personal history of other diseases of the circulatory system: Secondary | ICD-10-CM | POA: Diagnosis not present
